# Patient Record
Sex: Female | Born: 1985
Health system: Southern US, Community
[De-identification: ages and names within clinical notes are randomized; demographics above are authoritative.]

## PROBLEM LIST (undated history)

## (undated) ENCOUNTER — Inpatient Hospital Stay (HOSPITAL_COMMUNITY): Payer: Self-pay

## (undated) DIAGNOSIS — O200 Threatened abortion: Secondary | ICD-10-CM

## (undated) DIAGNOSIS — Z3493 Encounter for supervision of normal pregnancy, unspecified, third trimester: Secondary | ICD-10-CM

## (undated) DIAGNOSIS — N200 Calculus of kidney: Secondary | ICD-10-CM

## (undated) HISTORY — PX: KNEE SURGERY: SHX244

## (undated) HISTORY — DX: Calculus of kidney: N20.0

---
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
## 2006-12-19 ENCOUNTER — Ambulatory Visit (HOSPITAL_BASED_OUTPATIENT_CLINIC_OR_DEPARTMENT_OTHER): Admission: RE | Admit: 2006-12-19 | Discharge: 2006-12-19 | Payer: Self-pay | Admitting: Orthopaedic Surgery

## 2008-06-24 ENCOUNTER — Ambulatory Visit (HOSPITAL_BASED_OUTPATIENT_CLINIC_OR_DEPARTMENT_OTHER): Admission: RE | Admit: 2008-06-24 | Discharge: 2008-06-25 | Payer: Self-pay | Admitting: Orthopaedic Surgery

## 2009-06-22 LAB — CONVERTED CEMR LAB: Pap Smear: NORMAL

## 2009-07-21 ENCOUNTER — Ambulatory Visit (HOSPITAL_BASED_OUTPATIENT_CLINIC_OR_DEPARTMENT_OTHER): Admission: RE | Admit: 2009-07-21 | Discharge: 2009-07-21 | Payer: Self-pay | Admitting: Orthopaedic Surgery

## 2010-07-07 ENCOUNTER — Ambulatory Visit: Payer: Self-pay | Admitting: Family Medicine

## 2010-07-07 DIAGNOSIS — R071 Chest pain on breathing: Secondary | ICD-10-CM | POA: Insufficient documentation

## 2010-07-07 DIAGNOSIS — G43009 Migraine without aura, not intractable, without status migrainosus: Secondary | ICD-10-CM | POA: Insufficient documentation

## 2010-08-03 ENCOUNTER — Telehealth: Payer: Self-pay | Admitting: Family Medicine

## 2010-08-10 ENCOUNTER — Ambulatory Visit: Payer: Self-pay | Admitting: Family Medicine

## 2010-08-10 DIAGNOSIS — K644 Residual hemorrhoidal skin tags: Secondary | ICD-10-CM | POA: Insufficient documentation

## 2010-08-23 ENCOUNTER — Telehealth: Payer: Self-pay | Admitting: Family Medicine

## 2010-10-09 ENCOUNTER — Telehealth: Payer: Self-pay | Admitting: Family Medicine

## 2010-11-23 NOTE — Assessment & Plan Note (Signed)
Summary: NEW TO EST//CCM   Vital Signs:  Patient profile:   25 year old female Menstrual status:  regular LMP:     04/21/2010 Height:      67.25 inches Weight:      147 pounds BMI:     22.94 Temp:     98.0 degrees F oral Pulse rate:   72 / minute Pulse rhythm:   regular Resp:     12 per minute BP sitting:   120 / 78  (left arm) Cuff size:   regular  Vitals Entered By: Sid Falcon LPN (July 07, 2010 2:16 PM) CC: Headache LMP (date): 04/21/2010     Menstrual Status regular Enter LMP: 04/21/2010 Last PAP Result normal   History of Present Illness: Patient is new to establish care. She has history of frequent headaches which have been diagnosed as migraine. She takes Maxalt which helps for acute headaches but she is concerned because of increased frequency approximately one sometimes 2 per week recently. No clear triggers. Has never used prophylactic medications previously.  Other issue is chest pain. She had 3 weeks of very fleeting pain usually lasting 5-10 minutes which is sharp and left chest wall area. No associated dyspnea. Denies any cough, fever, chills, hemoptysis, or pleuritic pain. Exercises regularly never associated with exercise.  No ilicit drug use.  Takes birth control and no other medications. Nonsmoker.  Family history significant for hyperlipidemia and hypertension father.  Preventive Screening-Counseling & Management  Alcohol-Tobacco     Smoking Status: never  Caffeine-Diet-Exercise     Does Patient Exercise: yes  Past History:  Family History: Last updated: 07/07/2010 Family History of Arthritis, mother Family History High cholesterol, father Family History Hypertension, father Family History of Prostate, grandfather  Social History: Last updated: 07/07/2010 Currently unemployed Single Never Smoked Alcohol use-yes Regular exercise-yes  Risk Factors: Exercise: yes (07/07/2010)  Risk Factors: Smoking Status: never  (07/07/2010)  Past Medical History:  kidney stones migraines  Past Surgical History: Tubes in ears 1988 Knee surgeries 08', 09', 10' PMH-FH-SH reviewed for relevance  Family History: Family History of Arthritis, mother Family History High cholesterol, father Family History Hypertension, father Family History of Prostate, grandfather  Social History: Currently unemployed Single Never Smoked Alcohol use-yes Regular exercise-yes Smoking Status:  never Does Patient Exercise:  yes  Review of Systems  The patient denies anorexia, fever, weight loss, syncope, dyspnea on exertion, peripheral edema, prolonged cough, hemoptysis, abdominal pain, muscle weakness, depression, and enlarged lymph nodes.    Physical Exam  General:  Well-developed,well-nourished,in no acute distress; alert,appropriate and cooperative throughout examination Head:  Normocephalic and atraumatic without obvious abnormalities. No apparent alopecia or balding. Eyes:  No corneal or conjunctival inflammation noted. EOMI. Perrla. Funduscopic exam benign, without hemorrhages, exudates or papilledema. Vision grossly normal. Mouth:  Oral mucosa and oropharynx without lesions or exudates.  Teeth in good repair. Neck:  No deformities, masses, or tenderness noted. Chest Wall:  No deformities, masses, or tenderness noted. Lungs:  Normal respiratory effort, chest expands symmetrically. Lungs are clear to auscultation, no crackles or wheezes. Heart:  Normal rate and regular rhythm. S1 and S2 normal without gallop, murmur, click, rub or other extra sounds. Abdomen:  Bowel sounds positive,abdomen soft and non-tender without masses, organomegaly or hernias noted. Extremities:  No clubbing, cyanosis, edema, or deformity noted with normal full range of motion of all joints.   Neurologic:  alert & oriented X3, cranial nerves II-XII intact, strength normal in all extremities, and gait normal.  Impression &  Recommendations:  Problem # 1:  MIGRAINE HEADACHE (ICD-346.90) Assessment Deteriorated discussed possible triggers and potential use of prophylactics given freq of > 4/month.  Reluctant to use beta blockers with her avid exercise regimen.  trial of Topamax with titration reviewed with pt. Her updated medication list for this problem includes:    Maxalt-mlt 10 Mg Tbdp (Rizatriptan benzoate) ..... Use as directed as needed  Problem # 2:  CHEST WALL PAIN, ANTERIOR (ZOX-096.04) Assessment: New Very fleeting atypical pain-?neuropathic or gaseous.  Observe and further evaluate if more freq or worsening.  Problem # 3:  Preventive Health Care (ICD-V70.0) discussed immunizations..  Needs Tdap and flu vaccine and pt consents to both.  Complete Medication List: 1)  Microgestin Fe 1.5/30 1.5-30 Mg-mcg Tabs (Norethin ace-eth estrad-fe) .... Daily as directed 2)  Maxalt-mlt 10 Mg Tbdp (Rizatriptan benzoate) .... Use as directed as needed 3)  Topamax 25 Mg Tabs (Topiramate) .... Take as directed.  Other Orders: Tdap => 69yrs IM (54098) Admin 1st Vaccine (11914) Flu Vaccine 98yrs + (78295)  Patient Instructions: 1)  Please schedule a follow-up appointment in 1 month.  2)  Take Topamax as follows and titrate at weekly intervals: 3)  start 25 mg one tablet AM, then 25 mg two times a day , then 25 mg AM and 50 mg PM, then 50 mg two times a day  Prescriptions: TOPAMAX 25 MG TABS (TOPIRAMATE) take as directed.  #120 x 1   Entered and Authorized by:   Evelena Peat MD   Signed by:   Evelena Peat MD on 07/07/2010   Method used:   Print then Give to Patient   RxID:   [id number]   Preventive Care Screening  Pap Smear:    Date:  06/22/2009    Results:  normal      Colonscopy 2009    Immunizations Administered:  Tetanus Vaccine:    Vaccine Type: Tdap    Site: left deltoid    Mfr: GlaxoSmithKline    Dose: 0.5 ml    Route: IM    Given by: Sid Falcon LPN    Exp. Date:  07/12/2012    Lot #: [id number]         Flu Vaccine Consent Questions     Do you have a history of severe allergic reactions to this vaccine? no    Any prior history of allergic reactions to egg and/or gelatin? no    Do you have a sensitivity to the preservative Thimersol? no    Do you have a past history of Guillan-Barre Syndrome? no    Do you currently have an acute febrile illness? no    Have you ever had a severe reaction to latex? no    Vaccine information given and explained to patient? yes    Are you currently pregnant? no    Lot Number:[id number]   Exp Date:04/21/2011   Site Given  Left Deltoid IM

## 2010-11-23 NOTE — Progress Notes (Signed)
Summary: Pt req 3 month supply of Topomax to [facility]  Phone Note Refill Request Call back at [phone]  Patsy cell Message from:  mom-Patsy on October 09, 2010 4:55 PM  Refills Requested: Medication #1:  TOPAMAX 50 MG TABS one by mouth two times a day   Dosage confirmed as above?Dosage Confirmed   Supply Requested: 3 months Pts mom is req this to be called in for a 3 month supply.    Method Requested: Telephone to Pharmacy Initial call taken by: Lucy Antigua,  October 09, 2010 4:55 PM    Prescriptions: TOPAMAX 50 MG TABS (TOPIRAMATE) one by mouth two times a day  #180 x 3   Entered by:   Sid Falcon LPN   Authorized by:   Evelena Peat MD   Signed by:   Sid Falcon LPN on 45/40/9811   Method used:   Electronically to        [facility]* (retail)       [address]       [address] Shipping/mailing       [place], [place]  [address]       Ph: [phone]       Fax: [phone]   RxID:   [id number]

## 2010-11-23 NOTE — Progress Notes (Signed)
Summary: Pt req Hydrocortisone Suppository for Hemorrhoids  Phone Note Call from Patient Call back at [phone] pts cell   Caller: Patient Summary of Call: Pt called and has hemorrhoids. Is req med, Hydrocortisone Suppository.  Pt is out of town and is req med be sent to [facility] in [place] [phone]. Pt is sch to come in to see Dr. Caryl Never in approx 1 week.  Initial call taken by: Lucy Antigua,  August 03, 2010 11:02 AM  Follow-up for Phone Call        Rx called in as requested, pt informed on VM Follow-up by: Sid Falcon LPN,  August 03, 2010 5:40 PM    New/Updated Medications: HYDROCORTISONE ACETATE 25 MG SUPP (HYDROCORTISONE ACETATE) one supp inserted two times a day Prescriptions: HYDROCORTISONE ACETATE 25 MG SUPP (HYDROCORTISONE ACETATE) one supp inserted two times a day  #30 x 1   Entered by:   Sid Falcon LPN   Authorized by:   Evelena Peat MD   Signed by:   Sid Falcon LPN on 44/10/270   Method used:   Telephoned to ...         RxID:   [id number]

## 2010-11-23 NOTE — Assessment & Plan Note (Signed)
Summary: 1 month fup//ccm PT RSC/NJR----PT [facility] // RS   Vital Signs:  Patient profile:   25 year old female Menstrual status:  regular Weight:      150 pounds Temp:     97.6 degrees F oral BP sitting:   120 / 84  (left arm) Cuff size:   regular  Vitals Entered By: Sid Falcon LPN (August 10, 2010 8:54 AM)  History of Present Illness: Patient here for followup migraine headaches  Initiated Topamax and she has done extremely well. No side effects. Has only had one very mild headache since initiation. Overall greatly improved and happy with treatment.   Was having 4-5 migraines per month.  Also history of external hemorrhoids. Recent use of hydrocortisone suppositories. Improved past few days. No recent bleeding. No constipation issues. Takes stool softener and lots of dietary fiber. No recent straining.  Allergies (verified): No Known Drug Allergies  Past History:  Past Medical History: Last updated: 07/07/2010  kidney stones migraines  Review of Systems      See HPI  Physical Exam  General:  Well-developed,well-nourished,in no acute distress; alert,appropriate and cooperative throughout examination Lungs:  Normal respiratory effort, chest expands symmetrically. Lungs are clear to auscultation, no crackles or wheezes. Heart:  Normal rate and regular rhythm. S1 and S2 normal without gallop, murmur, click, rub or other extra sounds.   Impression & Recommendations:  Problem # 1:  MIGRAINE HEADACHE (ICD-346.90) Assessment Improved  Her updated medication list for this problem includes:    Maxalt-mlt 10 Mg Tbdp (Rizatriptan benzoate) ..... Use as directed as needed  Problem # 2:  HEMORRHOIDS, EXTERNAL (ICD-455.3) discussed measures to reduce constipation.  Discussed possible anoscopy but elected to wait since symptoms improved.  Complete Medication List: 1)  Microgestin Fe 1.5/30 1.5-30 Mg-mcg Tabs (Norethin ace-eth estrad-fe) .... Daily as directed 2)  Maxalt-mlt  10 Mg Tbdp (Rizatriptan benzoate) .... Use as directed as needed 3)  Topamax 50 Mg Tabs (Topiramate) .... One by mouth two times a day 4)  Hydrocortisone Acetate 25 Mg Supp (Hydrocortisone acetate) .... One supp inserted two times a day  Patient Instructions: 1)  Please schedule a follow-up appointment as needed .  Prescriptions: TOPAMAX 50 MG TABS (TOPIRAMATE) one by mouth two times a day  #60 x 11   Entered and Authorized by:   Evelena Peat MD   Signed by:   Evelena Peat MD on 08/10/2010   Method used:   Electronically to        [facility]* (retail)       [address]       [address] Shipping/mailing       [place], [place]  [address]       Ph: [phone]       Fax: [phone]   RxID:   [id number]    Orders Added: 1)  Est. Patient Level III [84132]

## 2010-11-23 NOTE — Progress Notes (Signed)
Summary: Diflucan  Phone Note Call from Patient Call back at Work Phone [phone]   Summary of Call: Pt thinks the hemorrhoid meds are giving her a vaginal infection.  ?????  Would like Diflucan?????   Uses Hydrocorisone Supp for hemorrhoids.  Initial call taken by: Lynann Beaver CMA AAMA,  August 23, 2010 10:46 AM  Follow-up for Phone Call        If she is having symptoms suggestive of yeast vaginitis such as pruritis and whitish vag discharge, may call in Diflucan 150 mg by mouth times one dose. Follow-up by: Evelena Peat MD,  August 23, 2010 10:52 AM  Additional Follow-up for Phone Call Additional follow up Details #1::        Pt called back and is req med be called in to [facility] in [place] [address] [place], [place] [phone]      Additional Follow-up by: Lucy Antigua,  August 23, 2010 11:17 AM    New/Updated Medications: DIFLUCAN 150 MG TABS (FLUCONAZOLE) one by mouth now Prescriptions: DIFLUCAN 150 MG TABS (FLUCONAZOLE) one by mouth now  #1 x 0   Entered by:   Lynann Beaver CMA AAMA   Authorized by:   Evelena Peat MD   Signed by:   Lynann Beaver CMA AAMA on 08/23/2010   Method used:   Electronically to        [id number]* (retail)       [address]       [place], [place]  [address]       Ph: [phone]       Fax: [phone]   RxID:   [id number]  Notified pt.  Appended Document: Diflucan No 150 mg. Diflucan in stock, so it was changed to 100 mg. and same dosage.

## 2011-01-26 LAB — POCT HEMOGLOBIN-HEMACUE: Hemoglobin: 15.5 g/dL — ABNORMAL HIGH (ref 12.0–15.0)

## 2011-03-06 NOTE — Op Note (Signed)
NAMEROREY, HODGES             ACCOUNT NO.:  [id number]   MEDICAL RECORD NO.:  [id number]          PATIENT TYPE:  AMB   LOCATION:  DSC                          FACILITY:  [facility]   PHYSICIAN:  Claude Manges. Whitfield, M.D.DATE OF BIRTH:  Jan 22, 1986   DATE OF PROCEDURE:  06/24/2008  DATE OF DISCHARGE:                               OPERATIVE REPORT   PREOPERATIVE DIAGNOSIS:  Displaced bucket-handle tear of medial  meniscus, right knee to anterior cruciate ligament, right knee.   POSTOPERATIVE DIAGNOSIS:  Displaced bucket-handle tear of medial  meniscus right knee with complete tear of anterior cruciate ligament.   PROCEDURES:  1. Diagnostic arthroscopy, right knee.  2. Debridement and partial medial meniscectomy.  3. Autograft bone-tendon-bone anterior cruciate ligament      reconstruction.   SURGEON:  Claude Manges. Cleophas Dunker, MD   ASSISTANT:  Oris Drone. Petrarca, PA-C   ANESTHESIA:  General with supplemental femoral nerve block.   COMPLICATIONS:  None.   HISTORY:  This 25 year old female works and lives in [place].  Playing  basketball several months ago, she injured her right knee.  She was seen  by local orthopedist with the possibility of an ACL tear and medial  meniscal tear of her right knee.  She has had several episodes of what  appears to be meniscal subluxation.  Initially, she had some trouble  with giving way sensation of her right knee, but she preferred to come  back to [facility] to be with her family for further evaluation.  She  has not had an MRI scan.  Clinically, she does have an ACL tear and a  tear of the medial meniscus, possibly bucket-handle and now to have an  arthroscopic evaluation.   PROCEDURE:  With the patient comfortable on the operating table and  under general orotracheal anesthesia, the right lower extremity was  placed in a thigh tourniquet.  The patient did have a preoperative  femoral nerve block.  The leg was then prepped with DuraPrep from  the  thigh holder and tourniquet to the ankle.  Sterile draping was  performed.   A examination under anesthesia revealed an anterior drawer sign and  positive Lachman's.   Diagnostic arthroscopy was performed using the medial and lateral  parapatellar tendon puncture site.  Arthroscope was placed medially.  There was a small clear yellow joint effusion.   Diagnostic arthroscopy revealed no evidence of loose material in the  superior pouch.  The patella tracked in the midline.  The lateral  compartment was clear of meniscal pathology or chondromalacia.   The ACL was completely absent.  PCL was intact.  There was a displaced  bucket-handle tear of the medial meniscus.  It was subluxed into the  intercondylar notch.  It was quite frayed and appeared to be in the  white-white zone.  It was significantly shredded particularly in the  posterior third.  I elected to debride the meniscus.  I resected its  anterior margin and posterior margin and debrided it with the coude  shaver, the basket forceps, and then sealed it with the ArthroCare wand.  At that point, I  could further evaluate the intercondylar notch.  There  was an anterior drawer sign.  We elected to proceed with a autograft,  ACL reconstruction using bone-tendon-bone.   The extremity was elevated.  It was Esmarch exsanguinated with the  proximal tourniquet at 350 mmHg.   A longitudinal incision was made just medial to the midline of the  patellar tendon extending from the inferior pole of the patella to the  tibial tubercle via sharp dissection.  Incision was carried down to the  subcutaneous tissue to the level of patellar tendon sheath.  This was  incised and carefully retracted medially and laterally.  We retracted  the skin so that we could see the entire patella.  There was at least 30  mm of patellar thickness, so we used a 10-mm double blade.  We had a 25-  mm bone plug proximally and a 25-mm bone plug distally in the  tibia with  a 10-mm thickness of the central third patellar tendon.  This was nicely  excised with nice bone plugs with proximally and distally.   Jacqualine Code prepared the graft and was with me throughout the  procedure.  The bone plugs were 9 mm at both ends and took the graft  essentially.   An intercondylar notch was then debrided with sort of an A-frame shape.  I used a 4 mm hooded bur to have a nice notchplasty and I could easily  visualize the posterior capsule.   The Arthrex guide system was utilized.  Initial hole was then made in  the proximal tibia through a small separate incision.  We inserted the  guide pin and then used the Cheatle guide to place into an perfectly  anatomic position.  A 9-mm bur was used to drill and was used to make a  9-mm hole.  We then debrided the soft tissue from the entrance into the  joint and any bony material from within the canal.   The femoral guide was inserted.  The knee was flexed beyond 90 degrees.  The Beath guide pin was then inserted and externalized along the distal  anterolateral thigh.  The 9-mm bur was then placed in the tibial hole,  into the notch, and into the femur.  We made a small footprint and felt  that we had complete bone circumferentially.  We made a 30-mm hole as  the bone plug on remeasurement was about 27-28 mm in length.  We cleaned  the femoral hole and found that we had complete bone circumferentially.  Suture on the femoral plug of the graft was then placed through the  Beath needle eye and then externalized through the small puncture wound  along the anterior distal thigh.  We carefully guided the graft to the  tibial hole, into the intercondylar notch, and into the femoral hole.  We had a very nice fit.  We applied tension of both grafts and then  through full range of motion the graft was recovered and it was nice and  tight with negative anterior drawer sign.  Small incision was then made  through the fat  pad through the graft harvest site.  Nitinol wire was  inserted.  A 7 x 25 interference screw was then inserted without  difficulty.  The sheath and the guide pin were then removed.  We checked  the screw, it was nice and tight.  With the knee flexed at about 30  degrees with a posterior drawer, a second interference screw was  placed  over the tibial plug measuring 8 x 25.  After inserting the guide pin,  the guide pin was removed and then we checked the graft, it was nice and  tight.  We had full extension and flexion without any abnormal tension  and negative anterior drawer sign.   The joint was irrigated with saline solution.  I applied bone graft from  the tibia into the patellar defect.  The patellar tendon sheath was  completely repaired with a running 0 Vicryl, the subcu with 2-0 Vicryl,  and skin closed with Steri-Strips over Benzoin.  Marcaine with  epinephrine was injected.  Sterile bulky dressing was applied.  Tourniquet was released at about an hour and half.   The patient tolerated the procedure without complications.   PLAN:  Recovery care, crutches, and office in 1 week.      Claude Manges. Cleophas Dunker, M.D.  Electronically Signed     PWW/MEDQ  D:  06/24/2008  T:  06/25/2008  Job:  [id number]

## 2011-03-09 NOTE — Op Note (Signed)
NAMESIMARA, RHYNER             ACCOUNT NO.:  [id number]   MEDICAL RECORD NO.:  [id number]          PATIENT TYPE:  AMB   LOCATION:  DSC                          FACILITY:  [facility]   PHYSICIAN:  Claude Manges. Whitfield, M.D.DATE OF BIRTH:  Dec 07, 1985   DATE OF PROCEDURE:  12/19/2006  DATE OF DISCHARGE:                               OPERATIVE REPORT   PREOPERATIVE DIAGNOSIS:  1. Tear medial meniscus left knee.  2. Possible tear of anterior cruciate ligament.   POSTOPERATIVE DIAGNOSIS:  1. Displaced bucket handle tear medial meniscus.  2. Complete tear of anterior cruciate ligament.   PROCEDURES:  1. Diagnostic arthroscopy left knee.  2. Debridement of torn anterior cruciate ligament.  3. Reduction of bucket handle tear medial meniscus and primary repair.   SURGEON:  Claude Manges. Cleophas Dunker, M.D.   ANESTHESIA:  IV sedation and local Xylocaine with epinephrine.   COMPLICATIONS:  None.   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 She had the immediate onset of  pain and swelling.  She had been using crutches and Vicodin for pain.  26 years seen locally in [place], [place]. An MRI scan was performed  but the films were incomplete and, therefore, nondiagnostic.  She was  seen yesterday in the office with evidence of a locked knee, a large  hemarthrosis. 80 mL of blood were removed from the knee.  There is a  concern that she has an ACL tear and a displaced bucket handle tear of  the medial meniscus now to have an arthroscopy.   PROCEDURE:  With the patient comfortable on the operating table and  under IV sedation, the left lower extremity was placed in a thigh  holder.  The leg was then prepped with DuraPrep from the thigh holder to  the ankle.  Sterile draping was performed.   Diagnostic arthroscopy was performed using the medial and lateral  parapatellar tendon stab wound.  There was still hemarthrosis which was  evacuated and the knee would still not completely extend.  Diagnostic  arthroscopy revealed an intact patella without any loose bodies in the  superior pouch.  I did not see any bleeding in the superior pouch.  The  lateral compartment was clear of meniscal pathology or injury. The  cartilage to the lateral compartment was intact.  There was a complete  tear of the anterior cruciate ligament with a large bulbous stump in the  intercondylar notch on the tibial side. The ACL was not apparent within  the notch or its femoral attachment.  There was a bulbous knot that  would flip from medial to lateral and, accordingly, this was debrided.  The PCL was intact.  I would not elicit an anterior drawer sign.   The medial compartment was then evaluated.  There was a displaced or  flipped bucket handle tear of the posterior third of the medial  meniscus.  I was able to reduce this through the medial portal with a  probe and, at that point, the knee would fully extend. It appeared to be  very  close to the red white zone and, accordingly, I elected to repair  using the Innovasive Clear Fix System.   I switched the arthroscope to the medial compartment to better visualize  the medial compartment and the meniscus and then inserted the probe  guides from the lateral portal. The meniscus was reduced.  I debrided  any rough edges.  The cannula was inserted followed by the sharp probe  and then the 2 mm wide 10 mm long threaded Clear Fix anchor.  I used  three in all and I thought I had a very nice repair, it was nice and  tight, I could not dislodge the meniscus and it was very easily to  dislodge it even after reduction prior to repair. The joint was then  copiously irrigated with saline solution.  The two puncture sites were  left open and infiltrated with 0.25% Marcaine with epinephrine.  A  sterile bulky dressing was applied followed by an Ace bandage.   PLAN:  Crutches, knee immobilizer, office  first of the week, Vicodin  and/or Percocet for pain.      Claude Manges. Cleophas Dunker, M.D.  Electronically Signed     PWW/MEDQ  D:  12/19/2006  T:  12/19/2006  Job:  [id number]

## 2011-05-28 ENCOUNTER — Encounter: Payer: Self-pay | Admitting: Family Medicine

## 2011-05-28 ENCOUNTER — Ambulatory Visit (INDEPENDENT_AMBULATORY_CARE_PROVIDER_SITE_OTHER): Payer: 59 | Admitting: Family Medicine

## 2011-05-28 VITALS — BP 120/80 | Temp 97.4°F | Wt 139.0 lb

## 2011-05-28 DIAGNOSIS — G43909 Migraine, unspecified, not intractable, without status migrainosus: Secondary | ICD-10-CM

## 2011-05-28 MED ORDER — SUMATRIPTAN SUCCINATE 100 MG PO TABS: ORAL_TABLET | ORAL | Status: DC

## 2011-05-28 NOTE — Progress Notes (Signed)
  Subjective:    Patient ID: Elizabeth Washington, female    DOB: 1986/05/24, 25 y.o.   MRN: [id number]  HPI History of migraine headaches.  Location is usually behind right eye. Sometimes one to 2 headaches per week. Throbbing quality moderate to severe. Usually relieved with sleep. Minimal relief with ibuprofen. Previously good success with Maxalt. Has occasional nausea but no vomiting. No photophobia. Worsens with activity. Takes Topamax 50 mg twice daily initially this seemed to prevent bit less effective recently. No correlation of headaches to menstrual periods.  No recent fever or chills. No purulent drainage. No postnasal drip symptoms. No clear triggers   Review of Systems  Constitutional: Negative for fever, chills, appetite change and unexpected weight change.  HENT: Negative for sore throat, neck stiffness, postnasal drip and sinus pressure.   Neurological: Positive for headaches. Negative for dizziness, seizures, syncope and weakness.  Hematological: Negative for adenopathy.       Objective:   Physical Exam  Constitutional: She is oriented to person, place, and time. She appears well-developed and well-nourished. No distress.  HENT:  Head: Normocephalic and atraumatic.  Right Ear: External ear normal.  Left Ear: External ear normal.  Mouth/Throat: Oropharynx is clear and moist.  Eyes: Pupils are equal, round, and reactive to light.       Funduscopic exam normal  Neck: Neck supple.  Cardiovascular: Normal rate, regular rhythm and normal heart sounds.   Pulmonary/Chest: Effort normal and breath sounds normal. No respiratory distress. She has no wheezes. She has no rales.  Lymphadenopathy:    She has no cervical adenopathy.  Neurological: She is alert and oriented to person, place, and time. No cranial nerve deficit.          Assessment & Plan:  Migraine headaches. Discussed possible triggers. Continue Topamax. Discussed other possible prophylactic measures. Try Imitrex  100 mg at onset of migraine and repeat in 2 hours as needed with maximum of 2 in 24 hours. If headaches increasing in frequency consider [facility] for [facility] evaluation

## 2011-08-27 ENCOUNTER — Telehealth: Payer: Self-pay | Admitting: Family Medicine

## 2011-08-27 MED ORDER — AZITHROMYCIN 1 G PO PACK: 1.0000 | PACK | Freq: Once | ORAL | Status: AC

## 2011-08-27 NOTE — Telephone Encounter (Signed)
Pt informed Rx sent. 

## 2011-08-27 NOTE — Telephone Encounter (Signed)
May call in but needs to be seen if no better in one week.

## 2011-08-27 NOTE — Telephone Encounter (Signed)
Pt would like a zpak call into [facility] [phone]  for cough/chest congestion/no fever.

## 2011-09-05 ENCOUNTER — Other Ambulatory Visit: Payer: Self-pay | Admitting: Family Medicine

## 2012-01-07 ENCOUNTER — Telehealth: Payer: Self-pay | Admitting: Family Medicine

## 2012-01-07 MED ORDER — TOPIRAMATE 50 MG PO TABS: 50.0000 mg | ORAL_TABLET | Freq: Two times a day (BID) | ORAL | Status: DC

## 2012-01-07 NOTE — Telephone Encounter (Signed)
Printed and signed RX faxed to [facility] at [phone], Rx confirmation received

## 2012-01-07 NOTE — Telephone Encounter (Signed)
Pt requesting a refill on her topiramate (TOPAMAX) 50 MG tablet Uses [facility].

## 2012-04-07 ENCOUNTER — Other Ambulatory Visit: Payer: Self-pay | Admitting: *Deleted

## 2012-04-07 ENCOUNTER — Telehealth: Payer: Self-pay | Admitting: Family Medicine

## 2012-04-07 MED ORDER — TOPIRAMATE 50 MG PO TABS: 50.0000 mg | ORAL_TABLET | Freq: Two times a day (BID) | ORAL | Status: DC

## 2012-04-07 NOTE — Telephone Encounter (Signed)
Pt states before she can receive a refill on topiramate (TOPAMAX) 50 MG tablet  She needs an OV. Pt stated she no longer on her parents insurance and it on [employer] and it will not cover an OV and pt does not have the money to pay out of pocket. Pt requesting to be contacted

## 2012-04-07 NOTE — Telephone Encounter (Signed)
Go ahead and refill for 6 months but will need office f/u before further refills.

## 2012-04-07 NOTE — Telephone Encounter (Signed)
Please advise, last OV was Aug of 2012

## 2012-04-07 NOTE — Telephone Encounter (Signed)
Pt informed on her VM that we will fill for 6 months and then will need return OV

## 2012-04-07 NOTE — Telephone Encounter (Signed)
Pt requested Rx be called into [facility] at [phone]

## 2012-08-04 ENCOUNTER — Telehealth: Payer: Self-pay | Admitting: Family Medicine

## 2012-08-04 MED ORDER — TOPIRAMATE 50 MG PO TABS: 50.0000 mg | ORAL_TABLET | Freq: Two times a day (BID) | ORAL | Status: DC

## 2012-08-04 NOTE — Telephone Encounter (Signed)
Pt called and has an ov with Dr Caryl Never next wk. Pt is going to run out of topiramate (TOPAMAX) 50 MG tablet before appt date. Pt is req a few pills to last until appt. Pls call in to [facility] pharmacy [phone]. Pt is aware that Dr Caryl Never is out of office this wk. Pls call in this wk.

## 2012-08-06 ENCOUNTER — Other Ambulatory Visit: Payer: Self-pay | Admitting: *Deleted

## 2012-08-06 MED ORDER — TOPIRAMATE 50 MG PO TABS: 50.0000 mg | ORAL_TABLET | Freq: Two times a day (BID) | ORAL | Status: DC

## 2012-08-06 NOTE — Telephone Encounter (Signed)
Pt called and said that the # to [facility] pharmacy is Phone # [phone] or fax# [phone].

## 2012-08-06 NOTE — Telephone Encounter (Signed)
Rx will be faxed as the phone number is the same as given me this AM, and it is only for students, not providers.

## 2012-08-06 NOTE — Telephone Encounter (Signed)
The number given to me for pharmacy did not allow me to call in Rx or talk to anyone in the pharmacy.  Pt informed and she will call back with another number

## 2012-08-12 ENCOUNTER — Ambulatory Visit (INDEPENDENT_AMBULATORY_CARE_PROVIDER_SITE_OTHER): Payer: Self-pay | Admitting: Family Medicine

## 2012-08-12 ENCOUNTER — Encounter: Payer: Self-pay | Admitting: Family Medicine

## 2012-08-12 VITALS — BP 120/84 | Temp 97.8°F | Wt 147.0 lb

## 2012-08-12 DIAGNOSIS — G43909 Migraine, unspecified, not intractable, without status migrainosus: Secondary | ICD-10-CM

## 2012-08-12 MED ORDER — TOPIRAMATE 50 MG PO TABS: 50.0000 mg | ORAL_TABLET | Freq: Two times a day (BID) | ORAL | Status: DC

## 2012-08-12 MED ORDER — ELETRIPTAN HYDROBROMIDE 40 MG PO TABS: 40.0000 mg | ORAL_TABLET | ORAL | Status: DC | PRN

## 2012-08-12 NOTE — Progress Notes (Signed)
  Subjective:    Patient ID: Elizabeth Washington, female    DOB: [birth year], 26 y.o.   MRN: [id number]  HPI  This is a followup migraine headaches. These occur usually around her menstrual cycle. Greatly improved with Topamax. Both frequency and severity have decreased. Requesting refills of Topamax. She is on birth control regularly. She is aware that she should not be on Topamax if eventually pursuing pregnancy. Her migraine headaches usually occur behind her right eye. Occasional nausea but no vomiting. Tried Imitrex but had some side effects. She had some nausea following this. She is willing to consider another tryptan medication. No recent change in headache character.  She is studying for her Masters degree at [employer]   Review of Systems  Constitutional: Negative for fever, diaphoresis, appetite change and unexpected weight change.  Respiratory: Negative for shortness of breath.   Cardiovascular: Negative for chest pain.  Neurological: Negative for dizziness and syncope.       Objective:   Physical Exam  Constitutional: She is oriented to person, place, and time. She appears well-developed and well-nourished.  HENT:  Mouth/Throat: Oropharynx is clear and moist.  Neck: Neck supple. No thyromegaly present.  Cardiovascular: Normal rate and regular rhythm.   Pulmonary/Chest: Effort normal and breath sounds normal. No respiratory distress. She has no wheezes. She has no rales.  Neurological: She is alert and oriented to person, place, and time.  Psychiatric: She has a normal mood and affect. Her behavior is normal.          Assessment & Plan:  Migraine headaches. Suspect menstrual related. Improved with Topamax. Refill Topamax for one year. Trial of Relpax 40 mg one at onset of migraine and may repeat once in 2 hours as needed. Discussed possible migraine triggers

## 2012-08-12 NOTE — Patient Instructions (Addendum)
Migraine Headache A migraine headache is an intense, throbbing pain on one or both sides of your head. A migraine can last for 30 minutes to several hours. CAUSES  The exact cause of a migraine headache is not always known. However, a migraine may be caused when nerves in the brain become irritated and release chemicals that cause inflammation. This causes pain. SYMPTOMS  Pain on one or both sides of your head.  Pulsating or throbbing pain.  Severe pain that prevents daily activities.  Pain that is aggravated by any physical activity.  Nausea, vomiting, or both.  Dizziness.  Pain with exposure to bright lights, loud noises, or activity.  General sensitivity to bright lights, loud noises, or smells. Before you get a migraine, you may get warning signs that a migraine is coming (aura). An aura may include:  Seeing flashing lights.  Seeing bright spots, halos, or zig-zag lines.  Having tunnel vision or blurred vision.  Having feelings of numbness or tingling.  Having trouble talking.  Having muscle weakness. MIGRAINE TRIGGERS  Alcohol.  Smoking.  Stress.  Menstruation.  Aged cheeses.  Foods or drinks that contain nitrates, glutamate, aspartame, or tyramine.  Lack of sleep.  Chocolate.  Caffeine.  Hunger.  Physical exertion.  Fatigue.  Medicines used to treat chest pain (nitroglycerine), birth control pills, estrogen, and some blood pressure medicines. DIAGNOSIS  A migraine headache is often diagnosed based on:  Symptoms.  Physical examination.  A CT scan or MRI of your head. TREATMENT Medicines may be given for pain and nausea. Medicines can also be given to help prevent recurrent migraines.  HOME CARE INSTRUCTIONS  Only take over-the-counter or prescription medicines for pain or discomfort as directed by your caregiver. The use of long-term narcotics is not recommended.  Lie down in a dark, quiet room when you have a migraine.  Keep a journal  to find out what may trigger your migraine headaches. For example, write down:  What you eat and drink.  How much sleep you get.  Any change to your diet or medicines.  Limit alcohol consumption.  Quit smoking if you smoke.  Get 7 to 9 hours of sleep, or as recommended by your caregiver.  Limit stress.  Keep lights dim if bright lights bother you and make your migraines worse. SEEK IMMEDIATE MEDICAL CARE IF:   Your migraine becomes severe.  You have a fever.  You have a stiff neck.  You have vision loss.  You have muscular weakness or loss of muscle control.  You start losing your balance or have trouble walking.  You feel faint or pass out.  You have severe symptoms that are different from your first symptoms. MAKE SURE YOU:   Understand these instructions.  Will watch your condition.  Will get help right away if you are not doing well or get worse. Document Released: 10/08/2005 Document Revised: 12/31/2011 Document Reviewed: 09/28/2011 ExitCare Patient Information 2013 [employer].  

## 2012-11-07 ENCOUNTER — Encounter: Payer: Self-pay | Admitting: Family Medicine

## 2012-11-07 ENCOUNTER — Ambulatory Visit (INDEPENDENT_AMBULATORY_CARE_PROVIDER_SITE_OTHER): Payer: BC Managed Care – PPO | Admitting: Family Medicine

## 2012-11-07 VITALS — BP 112/76 | HR 78 | Temp 98.1°F | Wt 147.0 lb

## 2012-11-07 DIAGNOSIS — A084 Viral intestinal infection, unspecified: Secondary | ICD-10-CM

## 2012-11-07 DIAGNOSIS — A088 Other specified intestinal infections: Secondary | ICD-10-CM

## 2012-11-07 NOTE — Progress Notes (Signed)
  Subjective:    Patient ID: Elizabeth Washington, female    DOB: August 12, 1986, 27 y.o.   MRN: [id number]  HPI Here for what she thinks it "food poisoning". 4 days ago she suddenly developed abdominal cramps, nausea and vomiting, and low grade fevers. This lasted through that night and then stopped by the next morning. She has not had and nausea or vomiting or fever for 3 days now. Her last BM was 2 days ago. Her appetite is down and she feels fatigued. She has been drinking fluids and eating a bland diet. She feels bloated but has no pain.    Review of Systems  Constitutional: Negative.   Respiratory: Negative.   Cardiovascular: Negative.   Gastrointestinal: Positive for abdominal distention. Negative for nausea, vomiting, abdominal pain, diarrhea, constipation and blood in stool.       Objective:   Physical Exam  Constitutional: She appears well-developed and well-nourished. No distress.  Abdominal: Soft. Bowel sounds are normal. She exhibits no distension and no mass. There is no tenderness. There is no rebound and no guarding.          Assessment & Plan:  Seems to be recovering from a GI virus. She will advance her diet as tolerated. I suggested she use a probiotic like Align daily for a few weeks to help her colon flora recover.

## 2012-12-06 ENCOUNTER — Telehealth: Payer: Self-pay | Admitting: Family Medicine

## 2012-12-06 ENCOUNTER — Other Ambulatory Visit: Payer: Self-pay | Admitting: *Deleted

## 2012-12-06 MED ORDER — TOPIRAMATE 50 MG PO TABS: 50.0000 mg | ORAL_TABLET | Freq: Two times a day (BID) | ORAL | Status: DC

## 2012-12-06 NOTE — Telephone Encounter (Signed)
Please call patient's mother when Rx is called in.

## 2012-12-06 NOTE — Telephone Encounter (Signed)
Rx was called into [facility] [address], only pharmacy on pt chart by mistake.  So I called in same to [facility] [address]

## 2012-12-06 NOTE — Telephone Encounter (Signed)
Pt mother walked into [facility] concerned that her daughters Topomax was not refilled.  It was explained to her our office have been closed due to inclement weather.  Because I was working with Dr Cato Mulligan at [facility], I called in 180 with 0 refills.  I did not want to further upset pt mother as we have been filling a 90 day supply in the past.  Mother informed Rx called in

## 2013-02-17 ENCOUNTER — Telehealth: Payer: Self-pay | Admitting: Family Medicine

## 2013-02-17 MED ORDER — TOPIRAMATE 50 MG PO TABS: 50.0000 mg | ORAL_TABLET | Freq: Two times a day (BID) | ORAL | Status: DC

## 2013-02-17 NOTE — Telephone Encounter (Signed)
Patient called stating that she need a refill of her topamax 50 mg 1po bid sent to [facility] [address]. Please assist.

## 2013-02-17 NOTE — Telephone Encounter (Signed)
Rx sent 

## 2013-05-20 ENCOUNTER — Telehealth: Payer: Self-pay | Admitting: Family Medicine

## 2013-05-20 NOTE — Telephone Encounter (Signed)
Appointment with Dr Clent Ridges was offered.

## 2013-05-20 NOTE — Telephone Encounter (Signed)
Patient Information:  Caller Name: Dennie Bible  Phone: [phone]  Patient: Elizabeth Washington, Elizabeth Washington  Gender: Female  DOB: [birth year]  Age: 27 Years  PCP: Evelena Peat [facility])  Pregnant: No  Office Follow Up:  Does the office need to follow up with this patient?: Yes  Instructions For The Office: Requesting medication to help with Migraine pain. Topamax and Ibuprofen not helping.   Symptoms  Reason For Call & Symptoms: Started as typical Migraine on 05/19/13 and today feels like ice pick is sticking through R eye and temple. Pain level #7 on 1-10 scale. Takes Topamax BID to prevent migraines- last took on 05/19/13 @ 1900 and it did not help. Took Ibuprofen 800 mgs at 0715 05/20/13 and Tramadol at 0745 05/20/13 and not helping. Afebrile. No other sx. Requesting medication for pain. [facility] [place]  Reviewed Health History In EMR: Yes  Reviewed Medications In EMR: Yes  Reviewed Allergies In EMR: Yes  Reviewed Surgeries / Procedures: Yes  Date of Onset of Symptoms: 05/19/2013  Treatments Tried: Topamax, Tramadol, Ibuprofen 800 mgs  Treatments Tried Worked: No OB / GYN:  LMP: 05/02/2013  Guideline(s) Used:  Headache  Disposition Per Guideline:   Callback by PCP or Subspecialist within 1 Hour  Reason For Disposition Reached:   Severe headache and has had severe headaches before  Advice Given:  Reassurance - Migraine Headache:  You have told me that this headache is similar to previous migraine headaches that you have had. If the pattern or severity of your headache changes, you will need to see your physician.  Migraine headaches are also called vascular headaches. A migraine can be anywhere from mild to severely painful. Sufferers often describe it as throbbing or pulsing. It is often just on one side. Associated symptoms include nausea and vomiting. Some individuals will have visual or other neurological warning symptoms (aura) that a migraine is coming.  This sounds like a  painful headache that you are having, but there are pain medications you can take and other instructions I can give you to reduce the pain.  Reassurance - Muscle Tension Headache:  The majority of headaches are caused by muscle tension.  Pain Medicines:  For pain relief, you can take either acetaminophen, ibuprofen, or naproxen.  Migraine Medication:   If your doctor has prescribed specific medication for your migraine, take it as directed as soon as the migraine starts.  Rest:   Lie down in a dark, quiet place and try to relax. Close your eyes and imagine your entire body relaxing.  Apply Cold to the Area:   Apply a cold wet washcloth or cold pack to the forehead for 20 minutes.  Stretching:   Stretch and massage any tight neck muscles.  Call Back If:  Headache lasts longer than 24 hours  You become worse.  Patient Will Follow Care Advice:  YES

## 2013-05-21 ENCOUNTER — Encounter: Payer: Self-pay | Admitting: Family Medicine

## 2013-05-21 ENCOUNTER — Ambulatory Visit (INDEPENDENT_AMBULATORY_CARE_PROVIDER_SITE_OTHER): Payer: BC Managed Care – PPO | Admitting: Family Medicine

## 2013-05-21 VITALS — BP 110/72 | HR 73 | Temp 97.9°F | Wt 150.0 lb

## 2013-05-21 DIAGNOSIS — G43909 Migraine, unspecified, not intractable, without status migrainosus: Secondary | ICD-10-CM

## 2013-05-21 MED ORDER — KETOROLAC TROMETHAMINE 60 MG/2ML IM SOLN
60.0000 mg | Freq: Once | INTRAMUSCULAR | Status: AC
  Administered 2013-05-21: 60 mg via INTRAMUSCULAR

## 2013-05-21 MED ORDER — SUMATRIPTAN SUCCINATE 50 MG PO TABS: 50.0000 mg | ORAL_TABLET | ORAL | Status: DC | PRN

## 2013-05-21 MED ORDER — PROMETHAZINE HCL 25 MG PO TABS: 25.0000 mg | ORAL_TABLET | Freq: Four times a day (QID) | ORAL | Status: DC | PRN

## 2013-05-21 MED ORDER — HYDROCODONE-ACETAMINOPHEN 5-325 MG PO TABS: 1.0000 | ORAL_TABLET | Freq: Four times a day (QID) | ORAL | Status: DC | PRN

## 2013-05-21 MED ORDER — TOPIRAMATE 50 MG PO TABS: 50.0000 mg | ORAL_TABLET | Freq: Two times a day (BID) | ORAL | Status: DC

## 2013-05-21 NOTE — Progress Notes (Signed)
  Subjective:    Patient ID: Elizabeth Washington, female    DOB: 07-05-1986, 27 y.o.   MRN: [id number]  HPI Here for a migraine that has lasted 3 days. It is typical for her, centered behind the right eye, and she has vomiting with it. She needs refills on Topamax. She tried Relpax but it caused her throat to get tight.    Review of Systems  Constitutional: Negative.   Respiratory: Negative.   Cardiovascular: Negative.   Neurological: Positive for headaches.       Objective:   Physical Exam  Constitutional: She is oriented to person, place, and time. She appears well-developed and well-nourished.  Eyes: Conjunctivae are normal. Pupils are equal, round, and reactive to light.  Neurological: She is alert and oriented to person, place, and time. She has normal reflexes. No cranial nerve deficit. She exhibits normal muscle tone. Coordination normal.          Assessment & Plan:  Given a Toradol shot. Use Vicodin and Phenergan prn. Written out of work yesterday and today. Try Imitrex 50 mg prn.

## 2013-05-21 NOTE — Addendum Note (Signed)
Addended by: Aniceto Boss A on: 05/21/2013 09:40 AM   Modules accepted: Orders

## 2013-10-12 ENCOUNTER — Encounter: Payer: Self-pay | Admitting: Family Medicine

## 2013-10-12 ENCOUNTER — Ambulatory Visit (INDEPENDENT_AMBULATORY_CARE_PROVIDER_SITE_OTHER): Payer: BC Managed Care – PPO | Admitting: Family Medicine

## 2013-10-12 VITALS — BP 110/70 | HR 67 | Temp 98.6°F | Wt 155.0 lb

## 2013-10-12 DIAGNOSIS — R51 Headache: Secondary | ICD-10-CM

## 2013-10-12 MED ORDER — PREDNISONE 10 MG PO TABS: ORAL_TABLET | ORAL | Status: DC

## 2013-10-12 MED ORDER — AMOXICILLIN-POT CLAVULANATE 875-125 MG PO TABS: 1.0000 | ORAL_TABLET | Freq: Two times a day (BID) | ORAL | Status: DC

## 2013-10-12 NOTE — Progress Notes (Signed)
Pre visit review using our clinic review tool, if applicable. No additional management support is needed unless otherwise documented below in the visit note. 

## 2013-10-12 NOTE — Progress Notes (Signed)
   Subjective:    Patient ID: Elizabeth Washington, female    DOB: 12/25/1985, 27 y.o.   MRN: [id number]  HPI Patient has history of migraine headaches. She presents now with slightly over 3 day history of headache behind right eye. Similar location headaches in past. Occasional throbbing. She's had some mild nausea off and on but no vomiting. No significant photophobia. Slightly worse with movement and activity. Denies any sinus congestion. No relief with sleep.  She recalls having somewhat similar headache several months ago. She took Imitrex with this headache without relief. She takes Topamax for prevention. Also use hydrocodone for this episode without relief. No clear triggering factors. No associated visual changes, fever, chills, purulent nasal secretions. She takes her Topamax regularly. She also takes birth control regularly and knows how vital this is while she is on Topamax.  Past Medical History  Diagnosis Date  . Kidney stone   . Migraine    Past Surgical History  Procedure Laterality Date  . Knee surgery  2008, 2009, 2010    reports that she has never smoked. She has never used smokeless tobacco. She reports that she drinks alcohol. She reports that she does not use illicit drugs. family history includes Arthritis in her mother; Cancer in an other family member; Heart disease in her father; Hyperlipidemia in her father. No Known Allergies    Review of Systems  Constitutional: Negative for fever, chills, appetite change and unexpected weight change.  Eyes: Negative for photophobia and visual disturbance.  Gastrointestinal: Positive for nausea. Negative for vomiting.  Neurological: Positive for headaches. Negative for dizziness, seizures, syncope and weakness.  Hematological: Negative for adenopathy.  Psychiatric/Behavioral: Negative for confusion.       Objective:   Physical Exam  Constitutional: She is oriented to person, place, and time. She appears well-developed  and well-nourished.  HENT:  Head: Normocephalic and atraumatic.  Mouth/Throat: Oropharynx is clear and moist.  Eyes: Pupils are equal, round, and reactive to light.  Neck: Neck supple.  Cardiovascular: Normal rate.   Pulmonary/Chest: Effort normal and breath sounds normal. No respiratory distress. She has no wheezes. She has no rales.  Lymphadenopathy:    She has no cervical adenopathy.  Neurological: She is alert and oriented to person, place, and time. No cranial nerve deficit. Coordination normal.          Assessment & Plan:  Right frontal headache. She has some features of vascular headache but somewhat unusual duration. This does not sound like tension type headache. She's already tried Imitrex and hydrocodone without much improvement. Doubt sinus infection. Trial of prednisone starting at 40 mg daily and taper. If she develops any: purulent nasal discharge, fever, or chills start Augmentin and otherwise hold antibiotics at this time. Touch base of headache not improving in the next 2 days.

## 2013-10-12 NOTE — Patient Instructions (Signed)
Start antibiotic for any fever or any purulent nasal discharge.

## 2013-11-03 ENCOUNTER — Other Ambulatory Visit: Payer: Self-pay | Admitting: Internal Medicine

## 2014-01-20 ENCOUNTER — Ambulatory Visit (INDEPENDENT_AMBULATORY_CARE_PROVIDER_SITE_OTHER): Payer: BC Managed Care – PPO | Admitting: Family Medicine

## 2014-01-20 VITALS — BP 110/70 | HR 68 | Temp 97.7°F | Wt 150.0 lb

## 2014-01-20 DIAGNOSIS — K644 Residual hemorrhoidal skin tags: Secondary | ICD-10-CM

## 2014-01-20 DIAGNOSIS — G43909 Migraine, unspecified, not intractable, without status migrainosus: Secondary | ICD-10-CM

## 2014-01-20 MED ORDER — HYDROCODONE-ACETAMINOPHEN 5-325 MG PO TABS: 1.0000 | ORAL_TABLET | ORAL | Status: DC | PRN

## 2014-01-20 MED ORDER — PROPRANOLOL HCL ER 60 MG PO CP24: 60.0000 mg | ORAL_CAPSULE | Freq: Every day | ORAL | Status: DC

## 2014-01-20 MED ORDER — HYDROCORTISONE 2.5 % RE CREA: 1.0000 "application " | TOPICAL_CREAM | Freq: Two times a day (BID) | RECTAL | Status: DC

## 2014-01-20 NOTE — Progress Notes (Signed)
   Subjective:    Patient ID: Elizabeth Washington, female    DOB: 1986/05/12, 28 y.o.   MRN: [id number]  HPI Long history of migraine headaches. She currently takes Topamax 50 mg twice daily. This is done a fairly good job preventing migraines but she wishes to make a change. She feels like she has some mental slowing and she would like a lipid other medication options. She's also consider things like acupuncture for prevention. She still has about one per month. She uses hydrocodone for breakthrough symptoms. She remains on birth control regularly.  No history of asthma. History external hemorrhoids and requesting refill of hydrocortisone 2.5% cream which she's used the past with good success.  Past Medical History  Diagnosis Date  . Kidney stone   . Migraine    Past Surgical History  Procedure Laterality Date  . Knee surgery  2008, 2009, 2010    reports that she has never smoked. She has never used smokeless tobacco. She reports that she drinks alcohol. She reports that she does not use illicit drugs. family history includes Arthritis in her mother; Cancer in an other family member; Heart disease in her father; Hyperlipidemia in her father. No Known Allergies    Review of Systems  Constitutional: Negative for fever, chills, appetite change and unexpected weight change.  Neurological: Negative for dizziness, seizures, syncope and headaches.  Psychiatric/Behavioral: Negative for dysphoric mood.       Objective:   Physical Exam  Constitutional: She is oriented to person, place, and time. She appears well-developed and well-nourished. No distress.  Cardiovascular: Normal rate and regular rhythm.   Pulmonary/Chest: Effort normal and breath sounds normal. No respiratory distress. She has no wheezes. She has no rales.  Musculoskeletal: She exhibits no edema.  Neurological: She is alert and oriented to person, place, and time. No cranial nerve deficit.          Assessment & Plan:    Migraine headaches. Patient requesting change from Topamax. We'll taper off Topamax and start Inderal long-acting 60 mg once daily. Review possible side effects.  External hemorrhoids. Refill Hydrocortone 2.5% cream twice daily as needed

## 2014-01-20 NOTE — Patient Instructions (Signed)
Reduce Topamax to one daily for 2 weeks then try discontinuing.

## 2014-01-20 NOTE — Progress Notes (Signed)
Pre visit review using our clinic review tool, if applicable. No additional management support is needed unless otherwise documented below in the visit note. 

## 2014-03-31 ENCOUNTER — Emergency Department (HOSPITAL_COMMUNITY)
Admission: EM | Admit: 2014-03-31 | Discharge: 2014-03-31 | Disposition: A | Discharge status: Home / Self Care | Payer: BC Managed Care – PPO | Attending: Emergency Medicine | Admitting: Emergency Medicine

## 2014-03-31 ENCOUNTER — Encounter (HOSPITAL_COMMUNITY): Payer: Self-pay | Admitting: Emergency Medicine

## 2014-03-31 DIAGNOSIS — G43109 Migraine with aura, not intractable, without status migrainosus: Secondary | ICD-10-CM | POA: Insufficient documentation

## 2014-03-31 DIAGNOSIS — R51 Headache: Secondary | ICD-10-CM

## 2014-03-31 DIAGNOSIS — Z87442 Personal history of urinary calculi: Secondary | ICD-10-CM | POA: Insufficient documentation

## 2014-03-31 DIAGNOSIS — R519 Headache, unspecified: Secondary | ICD-10-CM

## 2014-03-31 MED ORDER — DIPHENHYDRAMINE HCL 50 MG/ML IJ SOLN
25.0000 mg | Freq: Once | INTRAMUSCULAR | Status: AC
  Administered 2014-03-31: 25 mg via INTRAVENOUS
  Filled 2014-03-31: qty 1

## 2014-03-31 MED ORDER — SODIUM CHLORIDE 0.9 % IV BOLUS (SEPSIS)
1000.0000 mL | Freq: Once | INTRAVENOUS | Status: AC
  Administered 2014-03-31: 1000 mL via INTRAVENOUS

## 2014-03-31 MED ORDER — METOCLOPRAMIDE HCL 5 MG/ML IJ SOLN
10.0000 mg | Freq: Once | INTRAMUSCULAR | Status: AC
  Administered 2014-03-31: 10 mg via INTRAVENOUS
  Filled 2014-03-31: qty 2

## 2014-03-31 MED ORDER — KETOROLAC TROMETHAMINE 30 MG/ML IJ SOLN
30.0000 mg | Freq: Once | INTRAMUSCULAR | Status: AC
  Administered 2014-03-31: 30 mg via INTRAVENOUS
  Filled 2014-03-31: qty 1

## 2014-03-31 NOTE — Discharge Instructions (Signed)
Migraine Headache A migraine headache is an intense, throbbing pain on one or both sides of your head. A migraine can last for 30 minutes to several hours. CAUSES  The exact cause of a migraine headache is not always known. However, a migraine may be caused when nerves in the brain become irritated and release chemicals that cause inflammation. This causes pain. Certain things may also trigger migraines, such as:  Alcohol.  Smoking.  Stress.  Menstruation.  Aged cheeses.  Foods or drinks that contain nitrates, glutamate, aspartame, or tyramine.  Lack of sleep.  Chocolate.  Caffeine.  Hunger.  Physical exertion.  Fatigue.  Medicines used to treat chest pain (nitroglycerine), birth control pills, estrogen, and some blood pressure medicines. SIGNS AND SYMPTOMS  Pain on one or both sides of your head.  Pulsating or throbbing pain.  Severe pain that prevents daily activities.  Pain that is aggravated by any physical activity.  Nausea, vomiting, or both.  Dizziness.  Pain with exposure to bright lights, loud noises, or activity.  General sensitivity to bright lights, loud noises, or smells. Before you get a migraine, you may get warning signs that a migraine is coming (aura). An aura may include:  Seeing flashing lights.  Seeing bright spots, halos, or zig-zag lines.  Having tunnel vision or blurred vision.  Having feelings of numbness or tingling.  Having trouble talking.  Having muscle weakness. DIAGNOSIS  A migraine headache is often diagnosed based on:  Symptoms.  Physical exam.  A CT scan or MRI of your head. These imaging tests cannot diagnose migraines, but they can help rule out other causes of headaches. TREATMENT Medicines may be given for pain and nausea. Medicines can also be given to help prevent recurrent migraines.  HOME CARE INSTRUCTIONS  Only take over-the-counter or prescription medicines for pain or discomfort as directed by your  health care provider. The use of long-term narcotics is not recommended.  Lie down in a dark, quiet room when you have a migraine.  Keep a journal to find out what may trigger your migraine headaches. For example, write down:  What you eat and drink.  How much sleep you get.  Any change to your diet or medicines.  Limit alcohol consumption.  Quit smoking if you smoke.  Get 7 9 hours of sleep, or as recommended by your health care provider.  Limit stress.  Keep lights dim if bright lights bother you and make your migraines worse. SEEK IMMEDIATE MEDICAL CARE IF:   Your migraine becomes severe.  You have a fever.  You have a stiff neck.  You have vision loss.  You have muscular weakness or loss of muscle control.  You start losing your balance or have trouble walking.  You feel faint or pass out.  You have severe symptoms that are different from your first symptoms. MAKE SURE YOU:   Understand these instructions.  Will watch your condition.  Will get help right away if you are not doing well or get worse. Document Released: 10/08/2005 Document Revised: 07/29/2013 Document Reviewed: 06/15/2013 [employer] Patient Information 2014 [employer].  

## 2014-03-31 NOTE — ED Notes (Signed)
PT ambulated with baseline gait; VSS; A&Ox3; no signs of distress; respirations even and unlabored; skin warm and dry; no questions upon discharge.  

## 2014-03-31 NOTE — ED Notes (Signed)
Water given. Pt reports still feels pain but has decreased.

## 2014-03-31 NOTE — ED Notes (Signed)
Patient is resting comfortably. 

## 2014-03-31 NOTE — ED Notes (Signed)
Pt states migraine starting 3 days ago. Hx of them. Recently got off Topamax which she was on for years. OTC meds not working.

## 2014-03-31 NOTE — ED Provider Notes (Signed)
CSN: [id number]     Arrival date & time 03/31/14  [id number] History   First MD Initiated Contact with Patient 03/31/14 [phone]     Chief Complaint  Patient presents with  . Migraine     (Consider location/radiation/quality/duration/timing/severity/associated sxs/prior Treatment) HPI Comments: Patient presents with gradual onset right-sided headache for the past 3 days similar to previous migraines but more intense. She traveled from [place] 2 days ago and headaches started on the way back. She is been weaned off her Topamax because she was thinking of getting pregnant and has not had it for the past month. She endorses several episodes of nausea and vomiting and photophobia. No focal weakness, numbness or tingling. No bowel or bladder incontinence. No fevers. She's never had any imaging performed of her head. She denies any chest pain or shortness of breath. She is not on birth control.   The history is provided by the patient and the spouse.    Past Medical History  Diagnosis Date  . Kidney stone   . Migraine    Past Surgical History  Procedure Laterality Date  . Knee surgery  2008, 2009, 2010   Family History  Problem Relation Age of Onset  . Arthritis Mother   . Hyperlipidemia Father   . Heart disease Father   . Cancer      prostate/grandfather   History  Substance Use Topics  . Smoking status: Never Smoker   . Smokeless tobacco: Never Used  . Alcohol Use: Yes     Comment: rare   OB History   Grav Para Term Preterm Abortions TAB SAB Ect Mult Living                 Review of Systems  Constitutional: Negative for activity change and appetite change.  Eyes: Positive for photophobia.  Respiratory: Negative for cough, chest tightness and shortness of breath.   Cardiovascular: Negative for chest pain.  Gastrointestinal: Positive for nausea and vomiting. Negative for abdominal pain.  Genitourinary: Negative for dysuria, hematuria, vaginal bleeding and vaginal discharge.   Musculoskeletal: Negative for arthralgias, back pain and myalgias.  Skin: Negative for rash.  Neurological: Positive for headaches. Negative for dizziness and weakness.  A complete 10 system review of systems was obtained and all systems are negative except as noted in the HPI and PMH.      Allergies  Review of patient's allergies indicates no known allergies.  Home Medications   Prior to Admission medications   Medication Sig Start Date End Date Taking? Authorizing Provider  aspirin-acetaminophen-caffeine (EXCEDRIN MIGRAINE) [phone] MG per tablet Take 1 tablet by mouth every 6 (six) hours as needed for headache.   Yes Historical Provider, MD  HYDROcodone-acetaminophen (NORCO/VICODIN) 5-325 MG per tablet Take 1-2 tablets by mouth every 4 (four) hours as needed. 01/20/14  Yes Kristian CoveyBruce W Burchette, MD  hydrocortisone (PROCTOZONE-HC) 2.5 % rectal cream Place 1 application rectally 2 (two) times daily. 01/20/14  Yes Kristian CoveyBruce W Burchette, MD  ibuprofen (ADVIL,MOTRIN) 200 MG tablet Take 800 mg by mouth every 6 (six) hours as needed.   Yes Historical Provider, MD   BP 116/79  Pulse 68  Temp(Src) 97.7 F (36.5 C) (Oral)  Resp 17  SpO2 100%  LMP 03/15/2014 Physical Exam  Constitutional: She is oriented to person, place, and time. She appears well-developed and well-nourished. No distress.  HENT:  Head: Normocephalic and atraumatic.  Mouth/Throat: Oropharynx is clear and moist. No oropharyngeal exudate.  No temporal artery tenderness  Eyes: Conjunctivae and  EOM are normal. Pupils are equal, round, and reactive to light.  Neck: Normal range of motion. Neck supple.  No meningismus  Cardiovascular: Normal rate, regular rhythm, normal heart sounds and intact distal pulses.   Pulmonary/Chest: Effort normal and breath sounds normal. No respiratory distress.  Abdominal: Soft. Bowel sounds are normal. There is no tenderness. There is no rebound and no guarding.  Musculoskeletal: Normal range of  motion. She exhibits no edema and no tenderness.  Neurological: She is alert and oriented to person, place, and time. No cranial nerve deficit. She exhibits normal muscle tone. Coordination normal.  CN 2-12 intact, no ataxia on finger to nose, no nystagmus, 5/5 strength throughout, no pronator drift, Romberg negative, normal gait.   Skin: Skin is warm.    ED Course  Procedures (including critical care time) Labs Review Labs Reviewed - No data to display  Imaging Review No results found.   EKG Interpretation None      MDM   Final diagnoses:  Headache   gradual onset right-sided headache similar to previous migraines. No fever. No focal weakness, numbness or tingling.   No neurological deficits. No indication for emergent imaging. No thunderclap onset. Pain is controlled with migraine cocktail and patient is feeling better. She is ambulatory and tolerating by mouth. She is requesting to go home.  She will follow up with her PCP and with neurology. Return precautions discussed.  Glynn Octave, MD 03/31/14 [phone]

## 2014-04-08 ENCOUNTER — Telehealth: Payer: Self-pay | Admitting: *Deleted

## 2014-04-08 NOTE — Telephone Encounter (Signed)
Spoke with patient, appointment time has been changed 9 am on 04/14/14

## 2014-04-14 ENCOUNTER — Ambulatory Visit (INDEPENDENT_AMBULATORY_CARE_PROVIDER_SITE_OTHER): Payer: BC Managed Care – PPO | Admitting: Neurology

## 2014-04-14 ENCOUNTER — Encounter: Payer: Self-pay | Admitting: Neurology

## 2014-04-14 VITALS — BP 111/69 | HR 81 | Ht 68.0 in | Wt 154.0 lb

## 2014-04-14 DIAGNOSIS — G43009 Migraine without aura, not intractable, without status migrainosus: Secondary | ICD-10-CM

## 2014-04-14 NOTE — Progress Notes (Signed)
[facility]    Provider:  Dr Hosie PoissonSumner Referring Provider: Kristian CoveyBurchette, Bruce W, MD Primary Care Physician:  Kristian CoveyBURCHETTE,BRUCE W, MD  CC:  headache  HPI:  Elizabeth Washington is a 28 y.o. female here as a referral from Dr. Caryl NeverBurchette for headache  ED referral after gradual onset of right sided headache over 3 days. Headache started on trip back from [place]. Similar to prior migraines but more intense. Has been on Topamax in the past but stopped due to planning for pregnancy, has been off for >1 month. Most recent headache was right sided, stabbing throbbing type pain. Resolved after 4 days. + Nausea and emesis, + photophobia and phonophobia. Was similar to prior migraines but more intense. While on the topamax she was getting headaches around once per month but stopped as she felt it was not working.   Otherwise healthy. No recent fevers, neck stiffness, altered mental status.   Review of Systems: Out of a complete 14 system review, the patient complains of only the following symptoms, and all other reviewed systems are negative. + headache  History   Social History  . Marital Status: Single    Spouse Name: N/A    Number of Children: 0  . Years of Education: 12   Occupational History  .     Social History Main Topics  . Smoking status: Never Smoker   . Smokeless tobacco: Never Used  . Alcohol Use: Yes     Comment: rare  . Drug Use: No  . Sexual Activity: Not on file   Other Topics Concern  . Not on file   Social History Narrative   Patient lives at home with Husband Loraine LericheMark.   Patient has no children    Patient works at the [employer].    Patient has a Masters          Family History  Problem Relation Age of Onset  . Arthritis Mother   . Hyperlipidemia Father   . Heart disease Father   . Cancer      prostate/grandfather    Past Medical History  Diagnosis Date  . Kidney stone   . Migraine     Past Surgical History  Procedure  Laterality Date  . Knee surgery  2008, 2009, 2010    Current Outpatient Prescriptions  Medication Sig Dispense Refill  . Aspirin-Acetaminophen-Caffeine (EXCEDRIN MIGRAINE PO) Take by mouth.      Marland Kitchen. aspirin-acetaminophen-caffeine (EXCEDRIN MIGRAINE) 250-250-65 MG per tablet Take 1 tablet by mouth every 6 (six) hours as needed for headache.      Marland Kitchen. HYDROcodone-acetaminophen (NORCO/VICODIN) 5-325 MG per tablet Take 1-2 tablets by mouth every 4 (four) hours as needed.  30 tablet  0  . hydrocortisone (ANUSOL-HC) 2.5 % rectal cream Place 1 application rectally as needed.      Marland Kitchen. ibuprofen (ADVIL,MOTRIN) 200 MG tablet Take 800 mg by mouth every 6 (six) hours as needed.       No current facility-administered medications for this visit.    Allergies as of 04/14/2014  . (No Known Allergies)    Vitals: BP 111/69  Pulse 81  Ht 5\' 8"  (1.727 m)  Wt 154 lb (69.854 kg)  BMI 23.42 kg/m2  LMP 03/15/2014 Last Weight:  Wt Readings from Last 1 Encounters:  04/14/14 154 lb (69.854 kg)   Last Height:   Ht Readings from Last 1 Encounters:  04/14/14 5\' 8"  (1.727 m)     Physical exam: Exam: Gen: NAD, conversant Eyes:  anicteric sclerae, moist conjunctivae HENT: Atraumatic, oropharynx clear Neck: Trachea midline; supple,  Lungs: CTA, no wheezing, rales, rhonic                          CV: RRR, no MRG Abdomen: Soft, non-tender;  Extremities: No peripheral edema  Skin: Normal temperature, no rash,  Psych: Appropriate affect, pleasant  Neuro: MS: AA&Ox3, appropriately interactive, normal affect   Attention: WORLD backwards  Speech: fluent w/o paraphasic error  Memory: good recent and remote recall  CN: PERRL, EOMI no nystagmus, VFF to FC bilat, unable to fully visualize fundus due to pupil size, no ptosis, sensation intact to LT V1-V3 bilat, face symmetric, no weakness, hearing grossly intact, palate elevates symmetrically, shoulder shrug 5/5 bilat,  tongue protrudes midline, no  fasiculations noted.  Motor: normal bulk and tone Strength: 5/5  In all extremities  Coord: rapid alternating and point-to-point (FNF, HTS) movements intact.  Reflexes: symmetrical, bilat downgoing toes  Sens: LT intact in all extremities  Gait: posture, stance, stride and arm-swing normal. Tandem gait intact. Able to walk on heels and toes. Romberg absent.   Assessment:  After physical and neurologic examination, review of laboratory studies, imaging, neurophysiology testing and pre-existing records, assessment will be reviewed on the problem list.  Plan:  Treatment plan and additional workup will be reviewed under Problem List.  1)Migraine headache  27y/o woman presenting for initial evaluation of headaches consistent with a diagnosis of migraine headache. She reports being on topamax in the past and only having one headache a month while on this medication. She reports stopping because she was considering pregnancy and also because she did not feel it was working since she continued to have one headache a month. As she is still considering pregnancy I counseled her that her options are limited. Explained that for abortive agents I would limit her to tylenol during pregnancy. Gave her the option of daily magnesium or riboflavin for preventive therapy. Counseled her that there is no medication that can completely eliminate her headaches. She does not wish to start a medication at this time if she may still have breakthrough headaches. Counseled her on importance of good sleep hygiene, keeping a headache diary and avoiding triggers. She expressed understanding. Follow up as needed.   Elspeth ChoPeter Zandyr Barnhill, DO  [facility] [address] [place] [id number]  Phone [phone] Fax [phone]

## 2014-09-27 ENCOUNTER — Ambulatory Visit (INDEPENDENT_AMBULATORY_CARE_PROVIDER_SITE_OTHER): Payer: BLUE CROSS/BLUE SHIELD | Admitting: Family Medicine

## 2014-09-27 ENCOUNTER — Encounter: Payer: Self-pay | Admitting: Family Medicine

## 2014-09-27 VITALS — BP 112/82 | HR 74 | Temp 98.0°F | Ht 68.0 in | Wt 167.2 lb

## 2014-09-27 DIAGNOSIS — J329 Chronic sinusitis, unspecified: Secondary | ICD-10-CM

## 2014-09-27 MED ORDER — AMOXICILLIN-POT CLAVULANATE 875-125 MG PO TABS: 1.0000 | ORAL_TABLET | Freq: Two times a day (BID) | ORAL | Status: DC

## 2014-09-27 NOTE — Progress Notes (Signed)
HPI:  Sinus congestion: -started: 3 weeks ago -symptoms:nasal congestion, sore throat, cough - seemed to resolve for 5 days, then worse with sinus pain and thick yellow mucus, now improving again -denies:fever, SOB, NVD, tooth pain -has tried: tylenol cold medication OTC -sick contacts/travel/risks: denies flu exposure, tick exposure or or Ebola risks   ROS: See pertinent positives and negatives per HPI.  Past Medical History  Diagnosis Date  . Kidney stone   . Migraine     Past Surgical History  Procedure Laterality Date  . Knee surgery  2008, 2009, 2010    Family History  Problem Relation Age of Onset  . Arthritis Mother   . Hyperlipidemia Father   . Heart disease Father   . Cancer      prostate/grandfather    History   Social History  . Marital Status: Single    Spouse Name: N/A    Number of Children: 0  . Years of Education: 12   Occupational History  .     Social History Main Topics  . Smoking status: Never Smoker   . Smokeless tobacco: Never Used  . Alcohol Use: Yes     Comment: rare  . Drug Use: No  . Sexual Activity: None   Other Topics Concern  . None   Social History Narrative   Patient lives at home with Husband Loraine LericheMark.   Patient has no children    Patient works at the [employer].    Patient has a Scientist, water qualityMasters          Current outpatient prescriptions: aspirin-acetaminophen-caffeine (EXCEDRIN MIGRAINE) 250-250-65 MG per tablet, Take 1 tablet by mouth every 6 (six) hours as needed for headache., Disp: , Rfl:   EXAM:  Filed Vitals:   09/27/14 1614  BP: 112/82  Pulse: 74  Temp: 98 F (36.7 C)    Body mass index is 25.43 kg/(m^2).  GENERAL: vitals reviewed and listed above, alert, oriented, appears well hydrated and in no acute distress  HEENT: atraumatic, conjunttiva clear, no obvious abnormalities on inspection of external nose and ears, normal appearance of ear canals and TMs, clear nasal congestion, mild post  oropharyngeal erythema with PND, no tonsillar edema or exudate, no sinus TTP  NECK: no obvious masses on inspection  LUNGS: clear to auscultation bilaterally, no wheezes, rales or rhonchi, good air movement  CV: HRRR, no peripheral edema  MS: moves all extremities without noticeable abnormality  PSYCH: pleasant and cooperative, no obvious depression or anxiety  ASSESSMENT AND PLAN:  Discussed the following assessment and plan:  Sinusitis, unspecified chronicity, unspecified location   We discussed treatment side effects, likely course, antibiotic misuse, transmission, and signs of developing a serious illness. -she opted since feeling to try tx for presumed viral rhinosinusitis but has abx if worsening, sinus pain returns, not improving as expected  -of course, we advised to return or notify a doctor immediately if symptoms worsen or persist or new concerns arise.    Patient Instructions  INSTRUCTIONS FOR UPPER RESPIRATORY INFECTION:  -if sinus pain, worsening or not improving can start the antibiotic and complete the course - if you do not use please shred the prescription  -plenty of rest and fluids  -nasal saline wash 2-3 times daily (use prepackaged nasal saline or bottled/distilled water if making your own)   -can use AFRIN nasal spray for drainage and nasal congestion - but do NOT use longer then 3-4 days  -can use tylenol or ibuprofen as directed for  aches and sorethroat  -in the winter time, using a humidifier at night is helpful (please follow cleaning instructions)  -if you are taking a cough medication - use only as directed, may also try a teaspoon of honey to coat the throat and throat lozenges  -for sore throat, salt water gargles can help  -follow up if you have fevers, facial pain, tooth pain, difficulty breathing or are worsening or not getting better in 5-7 days      KIM, Dahlia ClientHANNAH R.

## 2014-09-27 NOTE — Patient Instructions (Signed)
INSTRUCTIONS FOR UPPER RESPIRATORY INFECTION:  -if sinus pain, worsening or not improving can start the antibiotic and complete the course - if you do not use please shred the prescription  -plenty of rest and fluids  -nasal saline wash 2-3 times daily (use prepackaged nasal saline or bottled/distilled water if making your own)   -can use AFRIN nasal spray for drainage and nasal congestion - but do NOT use longer then 3-4 days  -can use tylenol or ibuprofen as directed for aches and sorethroat  -in the winter time, using a humidifier at night is helpful (please follow cleaning instructions)  -if you are taking a cough medication - use only as directed, may also try a teaspoon of honey to coat the throat and throat lozenges  -for sore throat, salt water gargles can help  -follow up if you have fevers, facial pain, tooth pain, difficulty breathing or are worsening or not getting better in 5-7 days

## 2014-09-27 NOTE — Progress Notes (Signed)
Pre visit review using our clinic review tool, if applicable. No additional management support is needed unless otherwise documented below in the visit note. 

## 2014-11-28 ENCOUNTER — Inpatient Hospital Stay (HOSPITAL_COMMUNITY)
Admission: AD | Admit: 2014-11-28 | Discharge: 2014-11-28 | Disposition: A | Discharge status: Home / Self Care | Payer: BLUE CROSS/BLUE SHIELD | Source: Ambulatory Visit | Attending: Obstetrics | Admitting: Obstetrics

## 2014-11-28 ENCOUNTER — Encounter (HOSPITAL_COMMUNITY): Payer: Self-pay | Admitting: *Deleted

## 2014-11-28 ENCOUNTER — Inpatient Hospital Stay (HOSPITAL_COMMUNITY): Payer: BLUE CROSS/BLUE SHIELD

## 2014-11-28 DIAGNOSIS — Z3A11 11 weeks gestation of pregnancy: Secondary | ICD-10-CM | POA: Diagnosis not present

## 2014-11-28 DIAGNOSIS — O209 Hemorrhage in early pregnancy, unspecified: Secondary | ICD-10-CM

## 2014-11-28 DIAGNOSIS — O034 Incomplete spontaneous abortion without complication: Secondary | ICD-10-CM | POA: Insufficient documentation

## 2014-11-28 DIAGNOSIS — O200 Threatened abortion: Secondary | ICD-10-CM | POA: Clinically undetermined

## 2014-11-28 HISTORY — DX: Threatened abortion: O20.0

## 2014-11-28 LAB — CBC
HCT: 40.1 % (ref 36.0–46.0)
HEMOGLOBIN: 14 g/dL (ref 12.0–15.0)
MCH: 31.7 pg (ref 26.0–34.0)
MCHC: 34.9 g/dL (ref 30.0–36.0)
MCV: 90.7 fL (ref 78.0–100.0)
Platelets: 213 10*3/uL (ref 150–400)
RBC: 4.42 MIL/uL (ref 3.87–5.11)
RDW: 12 % (ref 11.5–15.5)
WBC: 6.4 10*3/uL (ref 4.0–10.5)

## 2014-11-28 LAB — HCG, QUANTITATIVE, PREGNANCY: hCG, Beta Chain, Quant, S: 13120 m[IU]/mL — ABNORMAL HIGH (ref <5)

## 2014-11-28 MED ORDER — PROMETHAZINE HCL 12.5 MG PO TABS: 12.5000 mg | ORAL_TABLET | Freq: Four times a day (QID) | ORAL | Status: DC | PRN

## 2014-11-28 MED ORDER — IBUPROFEN 600 MG PO TABS: 600.0000 mg | ORAL_TABLET | Freq: Four times a day (QID) | ORAL | Status: DC | PRN

## 2014-11-28 MED ORDER — IBUPROFEN 600 MG PO TABS: 600.0000 mg | ORAL_TABLET | Freq: Once | ORAL | Status: DC

## 2014-11-28 NOTE — MAU Provider Note (Signed)
History     CSN: [id number]  Arrival date and time: 11/28/14 4098  Provider notified: 2021 Provider on unit: 2015 Provider at bedside: 2120     Chief Complaint  Patient presents with  . Vaginal Bleeding  . Miscarriage   HPI  Ms. Elizabeth Washington is a 29 yo G1P0 at 11.4 wks by LMP presents tonight with complaints of lots of heavy vaginal bleeding that started about 1600 today.  She reports lots of cramping, passing large clots and saturating through 2 pads in 30 mins. The cramping stopped after the passing of large clots earlier. She was seen by Dr. Seymour Bars in the office 2/5 and dx'd with a 6 wks MAB.  She was planning a D&E on 2/12, if miscarriage had not passed before then.  Past Medical History  Diagnosis Date  . Migraine   . Kidney stone   . Miscarriage, threatened, early pregnancy 11/28/2014    Past Surgical History  Procedure Laterality Date  . Knee surgery  2008, 2009, 2010    Family History  Problem Relation Age of Onset  . Arthritis Mother   . Hyperlipidemia Father   . Heart disease Father   . Cancer      prostate/grandfather    History  Substance Use Topics  . Smoking status: Never Smoker   . Smokeless tobacco: Never Used  . Alcohol Use: Yes     Comment: rare    Allergies: No Known Allergies  Prescriptions prior to admission  Medication Sig Dispense Refill Last Dose  . ibuprofen (ADVIL,MOTRIN) 200 MG tablet Take 600 mg by mouth every 6 (six) hours as needed for moderate pain.   11/28/2014 at Unknown time  . Prenatal Vit-Fe Fumarate-FA (PRENATAL MULTIVITAMIN) TABS tablet Take 1 tablet by mouth daily at 12 noon.   11/28/2014 at Unknown time  . amoxicillin-clavulanate (AUGMENTIN) 875-125 MG per tablet Take 1 tablet by mouth 2 (two) times daily. (Patient not taking: Reported on 11/28/2014) 20 tablet 0     Review of Systems  Constitutional: Negative.   Eyes: Negative.   Cardiovascular: Negative.   Gastrointestinal:       Mild cramping around 1600   Genitourinary:       Heavy vaginal bleeding since 1600   Skin: Negative.   Neurological: Negative.   Endo/Heme/Allergies: Negative.   Psychiatric/Behavioral: Negative.    Results for orders placed or performed during the hospital encounter of 11/28/14 (from the past 24 hour(s))  CBC     Status: None   Collection Time: 11/28/14  7:00 PM  Result Value Ref Range   WBC 6.4 4.0 - 10.5 K/uL   RBC 4.42 3.87 - 5.11 MIL/uL   Hemoglobin 14.0 12.0 - 15.0 g/dL   HCT 11.9 14.7 - 82.9 %   MCV 90.7 78.0 - 100.0 fL   MCH 31.7 26.0 - 34.0 pg   MCHC 34.9 30.0 - 36.0 g/dL   RDW 56.2 13.0 - 86.5 %   Platelets 213 150 - 400 K/uL  hCG, quantitative, pregnancy     Status: Abnormal   Collection Time: 11/28/14  7:00 PM  Result Value Ref Range   hCG, Beta Chain, Quant, S 13120 (H) <5 mIU/mL   US Ob Comp Less 14 Wks  11/28/2014   CLINICAL DATA:  First trimester pregnancy with heavy vaginal bleeding  EXAM: OBSTETRIC <14 WK [place] AND TRANSVAGINAL OB US  TECHNIQUE: Both transabdominal and transvaginal ultrasound examinations were performed for complete evaluation of the gestation as well as the maternal  uterus, adnexal regions, and pelvic cul-de-sac. Transvaginal technique was performed to assess early pregnancy.  COMPARISON:  None.  FINDINGS: Intrauterine gestational sac: A normal intrauterine gestational sac is not identified  Yolk sac:  None  Embryo:  None  Cardiac Activity: None  Heart Rate: 0  bpm  Maternal uterus/adnexae: There is a complex hypoechoic fluid collection in the right upper endometrial canal, measuring about 16 mm. It does not demonstrate landmarks of an intrauterine gestational sac, and it changes appearance during scanning with internal movement of echoes consistent with debris and hemorrhage.  No subchorionic hemorrhage within the uterus. Ovaries are normal. No free fluid in the pelvis.  IMPRESSION: The patient states that she has suffered a known of missed abortion based on ultrasound performed in  her referring physician's office. The findings would appear to confirm that, with hemorrhage within the endometrial canal but no identified normal gestational sac.   Electronically Signed   By: Esperanza Heiraymond  Rubner M.D.   On: 11/28/2014 20:14    Physical Exam   Blood pressure 132/83, pulse 77, temperature 97.9 F (36.6 C), temperature source Oral, resp. rate 18, height 5\' 7"  (1.702 m), weight 78.019 kg (172 lb).  Physical Exam  Constitutional: She is oriented to person, place, and time. She appears well-developed and well-nourished.  HENT:  Head: Normocephalic and atraumatic.  Eyes: Pupils are equal, round, and reactive to light.  Neck: Normal range of motion.  Cardiovascular: Normal rate, regular rhythm and normal heart sounds.   Respiratory: Effort normal and breath sounds normal.  GI: Soft. Bowel sounds are normal.  Genitourinary:  No adnexal tenderness or masses; no CMT; small amount of dark, red blood cleaned from vaginal vault, no active bleeding from cervical os; 2 small clots noted on peripad  Musculoskeletal: Normal range of motion.  Neurological: She is alert and oriented to person, place, and time. She has normal reflexes.  Skin: Skin is warm and dry.  Psychiatric: She has a normal mood and affect. Her behavior is normal. Judgment and thought content normal.    MAU Course  Procedures CBC HCG  Assessment and Plan  No IUP seen on U/S Miscarriage, incomplete  Discharge home Rx: Ibuprofen 600 mg po every 6 hours prn mild to moderate pain       Phenergan 12.5 mg po every 4-6 hours prn N/V Follow-up in office in 1 wk Bleeding precautions given Call the office, if sx's worsen  Kenard GowerDAWSON, Geneive Sandstrom, M MSN, CNM 11/28/2014, 9:35 PM

## 2014-11-28 NOTE — Discharge Instructions (Signed)
Pelvic Rest - until all vaginal bleeding has stopped Pelvic rest is sometimes recommended for women when:   The placenta is partially or completely covering the opening of the cervix (placenta previa).  There is bleeding between the uterine wall and the amniotic sac in the first trimester (subchorionic hemorrhage).  The cervix begins to open without labor starting (incompetent cervix, cervical insufficiency).  The labor is too early (preterm labor). HOME CARE INSTRUCTIONS  Do not have sexual intercourse, stimulation, or an orgasm.  Do not use tampons, douche, or put anything in the vagina.  Do not lift anything over 10 pounds (4.5 kg).  Avoid strenuous activity or straining your pelvic muscles. SEEK MEDICAL CARE IF:  You have any vaginal bleeding during pregnancy. Treat this as a potential emergency.  You have cramping pain felt low in the stomach (stronger than menstrual cramps).  You notice vaginal discharge (watery, mucus, or bloody).  You have a low, dull backache.  There are regular contractions or uterine tightening. SEEK IMMEDIATE MEDICAL CARE IF: You have vaginal bleeding and have placenta previa.  Document Released: 02/02/2011 Document Revised: 12/31/2011 Document Reviewed: 02/02/2011 [facility] CenterExit[facility] Patient Information 2015 [place] Center[place], [place]. This information is not intended to replace advice given to you by your health care provider. Make sure you discuss any questions you have with your health care provider. Miscarriage A miscarriage is the sudden loss of an unborn baby (fetus) before the 20th week of pregnancy. Most miscarriages happen in the first 3 months of pregnancy. Sometimes, it happens before a woman even knows she is pregnant. A miscarriage is also called a "spontaneous miscarriage" or "early pregnancy loss." Having a miscarriage can be an emotional experience. Talk with your caregiver about any questions you may have about miscarrying, the grieving process, and your  future pregnancy plans. CAUSES   Problems with the fetal chromosomes that make it impossible for the baby to develop normally. Problems with the baby's genes or chromosomes are most often the result of errors that occur, by chance, as the embryo divides and grows. The problems are not inherited from the parents.  Infection of the cervix or uterus.   Hormone problems.   Problems with the cervix, such as having an incompetent cervix. This is when the tissue in the cervix is not strong enough to hold the pregnancy.   Problems with the uterus, such as an abnormally shaped uterus, uterine fibroids, or congenital abnormalities.   Certain medical conditions.   Smoking, drinking alcohol, or taking illegal drugs.   Trauma.  Often, the cause of a miscarriage is unknown.  SYMPTOMS   Vaginal bleeding or spotting, with or without cramps or pain.  Pain or cramping in the abdomen or lower back.  Passing fluid, tissue, or blood clots from the vagina. DIAGNOSIS  Your caregiver will perform a physical exam. You may also have an ultrasound to confirm the miscarriage. Blood or urine tests may also be ordered. TREATMENT   Sometimes, treatment is not necessary if you naturally pass all the fetal tissue that was in the uterus. If some of the fetus or placenta remains in the body (incomplete miscarriage), tissue left behind may become infected and must be removed. Usually, a dilation and curettage (D and C) procedure is performed. During a D and C procedure, the cervix is widened (dilated) and any remaining fetal or placental tissue is gently removed from the uterus.  Antibiotic medicines are prescribed if there is an infection. Other medicines may be given to reduce the  size of the uterus (contract) if there is a lot of bleeding.  If you have Rh negative blood and your baby was Rh positive, you will need a Rh immunoglobulin shot. This shot will protect any future baby from having Rh blood problems  in future pregnancies. HOME CARE INSTRUCTIONS   Your caregiver may order bed rest or may allow you to continue light activity. Resume activity as directed by your caregiver.  Have someone help with home and family responsibilities during this time.   Keep track of the number of sanitary pads you use each day and how soaked (saturated) they are. Write down this information.   Do not use tampons. Do not douche or have sexual intercourse until approved by your caregiver.   Only take over-the-counter or prescription medicines for pain or discomfort as directed by your caregiver.   Do not take aspirin. Aspirin can cause bleeding.   Keep all follow-up appointments with your caregiver.   If you or your partner have problems with grieving, talk to your caregiver or seek counseling to help cope with the pregnancy loss. Allow enough time to grieve before trying to get pregnant again.  SEEK IMMEDIATE MEDICAL CARE IF:   You have severe cramps or pain in your back or abdomen.  You have a fever.  You pass large blood clots (walnut-sized or larger) ortissue from your vagina. Save any tissue for your caregiver to inspect.   Your bleeding increases.   You have a thick, bad-smelling vaginal discharge.  You become lightheaded, weak, or you faint.   You have chills.  MAKE SURE YOU:  Understand these instructions.  Will watch your condition.  Will get help right away if you are not doing well or get worse. Document Released: 04/03/2001 Document Revised: 02/02/2013 Document Reviewed: 11/27/2011 [facility] Patient Information 2015 [place], [place]. This information is not intended to replace advice given to you by your health care provider. Make sure you discuss any questions you have with your health care provider.

## 2014-11-28 NOTE — MAU Note (Signed)
Passed a few clots then noticed to have soaked through 2 pads in half hour

## 2014-11-29 ENCOUNTER — Other Ambulatory Visit: Payer: Self-pay | Admitting: Obstetrics & Gynecology

## 2014-12-03 ENCOUNTER — Ambulatory Visit: Admit: 2014-12-03 | Payer: Self-pay | Admitting: Obstetrics & Gynecology

## 2014-12-03 SURGERY — DILATION AND EVACUATION, UTERUS
Anesthesia: Choice

## 2015-05-04 ENCOUNTER — Encounter: Payer: Self-pay | Admitting: Family Medicine

## 2015-07-08 ENCOUNTER — Inpatient Hospital Stay (HOSPITAL_COMMUNITY)
Admission: AD | Admit: 2015-07-08 | Discharge: 2015-07-08 | Disposition: A | Discharge status: Home / Self Care | Payer: 59 | Source: Ambulatory Visit | Attending: Obstetrics and Gynecology | Admitting: Obstetrics and Gynecology

## 2015-07-08 ENCOUNTER — Encounter (HOSPITAL_COMMUNITY): Payer: Self-pay

## 2015-07-08 DIAGNOSIS — Z3A08 8 weeks gestation of pregnancy: Secondary | ICD-10-CM | POA: Insufficient documentation

## 2015-07-08 DIAGNOSIS — Z87442 Personal history of urinary calculi: Secondary | ICD-10-CM | POA: Diagnosis not present

## 2015-07-08 DIAGNOSIS — O210 Mild hyperemesis gravidarum: Secondary | ICD-10-CM | POA: Insufficient documentation

## 2015-07-08 DIAGNOSIS — R112 Nausea with vomiting, unspecified: Secondary | ICD-10-CM | POA: Diagnosis present

## 2015-07-08 DIAGNOSIS — O219 Vomiting of pregnancy, unspecified: Secondary | ICD-10-CM

## 2015-07-08 LAB — COMPREHENSIVE METABOLIC PANEL
ALT: 18 U/L (ref 14–54)
AST: 17 U/L (ref 15–41)
Albumin: 4.2 g/dL (ref 3.5–5.0)
Alkaline Phosphatase: 39 U/L (ref 38–126)
Anion gap: 7 (ref 5–15)
BUN: 12 mg/dL (ref 6–20)
CHLORIDE: 105 mmol/L (ref 101–111)
CO2: 23 mmol/L (ref 22–32)
CREATININE: 0.67 mg/dL (ref 0.44–1.00)
Calcium: 8.7 mg/dL — ABNORMAL LOW (ref 8.9–10.3)
Glucose, Bld: 83 mg/dL (ref 65–99)
POTASSIUM: 4.3 mmol/L (ref 3.5–5.1)
SODIUM: 135 mmol/L (ref 135–145)
Total Bilirubin: 0.5 mg/dL (ref 0.3–1.2)
Total Protein: 7 g/dL (ref 6.5–8.1)

## 2015-07-08 LAB — URINALYSIS, ROUTINE W REFLEX MICROSCOPIC
Bilirubin Urine: NEGATIVE
GLUCOSE, UA: NEGATIVE mg/dL
Hgb urine dipstick: NEGATIVE
Leukocytes, UA: NEGATIVE
Nitrite: NEGATIVE
PROTEIN: NEGATIVE mg/dL
Specific Gravity, Urine: >1.03 — ABNORMAL HIGH (ref 1.005–1.030)
Urobilinogen, UA: 1 mg/dL (ref 0.0–1.0)
pH: 6 (ref 5.0–8.0)

## 2015-07-08 MED ORDER — LACTATED RINGERS IV BOLUS (SEPSIS)
1000.0000 mL | Freq: Once | INTRAVENOUS | Status: AC
  Administered 2015-07-08: 1000 mL via INTRAVENOUS

## 2015-07-08 MED ORDER — PROMETHAZINE HCL 25 MG/ML IJ SOLN
25.0000 mg | Freq: Once | INTRAMUSCULAR | Status: AC
  Administered 2015-07-08: 25 mg via INTRAVENOUS
  Filled 2015-07-08: qty 1

## 2015-07-08 MED ORDER — PROMETHAZINE HCL 25 MG PO TABS: 25.0000 mg | ORAL_TABLET | Freq: Four times a day (QID) | ORAL | Status: DC | PRN

## 2015-07-08 NOTE — MAU Note (Signed)
Pt here from MD office for hyperemesis. To get iv fluids. Has not taken any n/v meds.

## 2015-07-08 NOTE — MAU Provider Note (Signed)
MAU HISTORY AND PHYSICAL  Chief Complaint:  Nausea/vomiting  Had ultrasound on Tuesday, had heart rate, read as normal per patient.  Symptoms began two days ago, though has had some nausea for a couple of weeks. Trouble keeping down food and liquids. Urinated three times today. Prescribed b6 and doxylamine today. No abdominal pain, no diarrhea, no fever or chills. No dysuria, no back pain, no urinary frequency. No sick contacts. No vaginal bleeding, no pelvic pain or cramps.     Past Medical History  Diagnosis Date  . Migraine   . Kidney stone   . Miscarriage, threatened, early pregnancy 11/28/2014    Past Surgical History  Procedure Laterality Date  . Knee surgery  2008, 2009, 2010    Family History  Problem Relation Age of Onset  . Arthritis Mother   . Hyperlipidemia Father   . Heart disease Father   . Cancer      prostate/grandfather    Social History  Substance Use Topics  . Smoking status: Never Smoker   . Smokeless tobacco: Never Used  . Alcohol Use: Yes     Comment: rare    No Known Allergies  Prescriptions prior to admission  Medication Sig Dispense Refill Last Dose  . ibuprofen (ADVIL,MOTRIN) 200 MG tablet Take 600 mg by mouth every 6 (six) hours as needed for moderate pain.   11/28/2014 at Unknown time  . ibuprofen (ADVIL,MOTRIN) 600 MG tablet Take 1 tablet (600 mg total) by mouth every 6 (six) hours as needed for mild pain or moderate pain. 30 tablet 0   . Prenatal Vit-Fe Fumarate-FA (PRENATAL MULTIVITAMIN) TABS tablet Take 1 tablet by mouth daily at 12 noon.   11/28/2014 at Unknown time  . promethazine (PHENERGAN) 12.5 MG tablet Take 1 tablet (12.5 mg total) by mouth every 6 (six) hours as needed for nausea or vomiting. 30 tablet 0     Review of Systems - Negative except for what is mentioned in HPI.  Physical Exam  Blood pressure 117/73, pulse 70, temperature 97.5 F (36.4 C), temperature source Oral, resp. rate 16, height 5\' 8"  (1.727 m), weight 165 lb 4  oz (74.957 kg). GENERAL: Well-developed, well-nourished female in no acute distress.  HEENT: MMM LUNGS: Clear to auscultation bilaterally.  HEART: Regular rate and rhythm. ABDOMEN: Soft, nontender, nondistended, gravid.  EXTREMITIES: Nontender, no edema, 2+ distal pulses.    Labs: Results for orders placed or performed during the hospital encounter of 07/08/15 (from the past 24 hour(s))  Urinalysis, Routine w reflex microscopic (not at [facility])   Collection Time: 07/08/15  5:00 PM  Result Value Ref Range   Color, Urine AMBER (A) YELLOW   APPearance CLEAR CLEAR   Specific Gravity, Urine >1.030 (H) 1.005 - 1.030   pH 6.0 5.0 - 8.0   Glucose, UA NEGATIVE NEGATIVE mg/dL   Hgb urine dipstick NEGATIVE NEGATIVE   Bilirubin Urine NEGATIVE NEGATIVE   Ketones, ur >80 (A) NEGATIVE mg/dL   Protein, ur NEGATIVE NEGATIVE mg/dL   Urobilinogen, UA 1.0 0.0 - 1.0 mg/dL   Nitrite NEGATIVE NEGATIVE   Leukocytes, UA NEGATIVE NEGATIVE    Imaging Studies:  No results found.  Assessment: Elizabeth Washington is  29 y.o. G2P0010 at 108w5d presents with nausea/vomiting of pregnancy. No tachycardia or dry mucous membranes on exam, but significant ketones in urine. CMP unremarkable, no abdominal pain or constitutional symptoms to suggest etiology other than n/v of pregnancy. Urine not suggestive of infection. Recent u/s per patient normal, so minimal  concern for gestational trophic disease and twin pregnancy. Have given phenergan and 2 L LR and patient feeling much better, tolerated fluids, and has had no vomiting and significant reduction of nausea.  Plan: - home with push po fluids, oral phenergan, and ob f/u this coming week, with dehydration return precautions.  Elizabeth Washington 9/16/20165:35 PM

## 2015-07-08 NOTE — Discharge Instructions (Signed)

## 2015-07-11 LAB — OB RESULTS CONSOLE GC/CHLAMYDIA
CHLAMYDIA, DNA PROBE: NEGATIVE
Gonorrhea: NEGATIVE

## 2015-07-11 LAB — OB RESULTS CONSOLE HEPATITIS B SURFACE ANTIGEN: Hepatitis B Surface Ag: NEGATIVE

## 2015-07-11 LAB — OB RESULTS CONSOLE ABO/RH: RH Type: POSITIVE

## 2015-07-11 LAB — OB RESULTS CONSOLE RPR: RPR: NONREACTIVE

## 2015-07-11 LAB — OB RESULTS CONSOLE RUBELLA ANTIBODY, IGM: RUBELLA: NON-IMMUNE/NOT IMMUNE

## 2015-07-11 LAB — OB RESULTS CONSOLE ANTIBODY SCREEN: Antibody Screen: NEGATIVE

## 2015-07-11 LAB — OB RESULTS CONSOLE HIV ANTIBODY (ROUTINE TESTING): HIV: NONREACTIVE

## 2015-07-20 ENCOUNTER — Inpatient Hospital Stay (HOSPITAL_COMMUNITY)
Admission: AD | Admit: 2015-07-20 | Discharge: 2015-07-20 | Disposition: A | Discharge status: Home / Self Care | Payer: Managed Care, Other (non HMO) | Source: Ambulatory Visit | Attending: Obstetrics and Gynecology | Admitting: Obstetrics and Gynecology

## 2015-07-20 ENCOUNTER — Encounter (HOSPITAL_COMMUNITY): Payer: Self-pay | Admitting: *Deleted

## 2015-07-20 DIAGNOSIS — O219 Vomiting of pregnancy, unspecified: Secondary | ICD-10-CM | POA: Diagnosis not present

## 2015-07-20 DIAGNOSIS — O26891 Other specified pregnancy related conditions, first trimester: Secondary | ICD-10-CM | POA: Insufficient documentation

## 2015-07-20 DIAGNOSIS — R112 Nausea with vomiting, unspecified: Secondary | ICD-10-CM | POA: Diagnosis present

## 2015-07-20 DIAGNOSIS — E860 Dehydration: Secondary | ICD-10-CM

## 2015-07-20 DIAGNOSIS — Z3A10 10 weeks gestation of pregnancy: Secondary | ICD-10-CM | POA: Diagnosis not present

## 2015-07-20 LAB — URINALYSIS, ROUTINE W REFLEX MICROSCOPIC
Bilirubin Urine: NEGATIVE
Glucose, UA: NEGATIVE mg/dL
Hgb urine dipstick: NEGATIVE
Ketones, ur: >80 mg/dL — AB
LEUKOCYTES UA: NEGATIVE
NITRITE: NEGATIVE
PROTEIN: NEGATIVE mg/dL
Specific Gravity, Urine: 1.015 (ref 1.005–1.030)
Urobilinogen, UA: 0.2 mg/dL (ref 0.0–1.0)
pH: 6.5 (ref 5.0–8.0)

## 2015-07-20 MED ORDER — PROMETHAZINE HCL 25 MG/ML IJ SOLN
25.0000 mg | Freq: Once | INTRAMUSCULAR | Status: AC
  Administered 2015-07-20: 25 mg via INTRAVENOUS
  Filled 2015-07-20: qty 1

## 2015-07-20 MED ORDER — M.V.I. ADULT IV INJ
INJECTION | Freq: Once | INTRAVENOUS | Status: AC
  Administered 2015-07-20: 15:00:00 via INTRAVENOUS
  Filled 2015-07-20: qty 10

## 2015-07-20 MED ORDER — METOCLOPRAMIDE HCL 10 MG PO TABS: 10.0000 mg | ORAL_TABLET | Freq: Three times a day (TID) | ORAL | Status: DC | PRN

## 2015-07-20 NOTE — Discharge Instructions (Signed)
Dehydration, Adult Dehydration is when you lose more fluids from the body than you take in. Vital organs like the kidneys, brain, and heart cannot function without a proper amount of fluids and salt. Any loss of fluids from the body can cause dehydration.  CAUSES   Vomiting.  Diarrhea.  Excessive sweating.  Excessive urine output.  Fever. SYMPTOMS  Mild dehydration  Thirst.  Dry lips.  Slightly dry mouth. Moderate dehydration  Very dry mouth.  Sunken eyes.  Skin does not bounce back quickly when lightly pinched and released.  Dark urine and decreased urine production.  Decreased tear production.  Headache. Severe dehydration  Very dry mouth.  Extreme thirst.  Rapid, weak pulse (more than 100 beats per minute at rest).  Cold hands and feet.  Not able to sweat in spite of heat and temperature.  Rapid breathing.  Blue lips.  Confusion and lethargy.  Difficulty being awakened.  Minimal urine production.  No tears. DIAGNOSIS  Your caregiver will diagnose dehydration based on your symptoms and your exam. Blood and urine tests will help confirm the diagnosis. The diagnostic evaluation should also identify the cause of dehydration. TREATMENT  Treatment of mild or moderate dehydration can often be done at home by increasing the amount of fluids that you drink. It is best to drink small amounts of fluid more often. Drinking too much at one time can make vomiting worse. Refer to the home care instructions below. Severe dehydration needs to be treated at the hospital where you will probably be given intravenous (IV) fluids that contain water and electrolytes. HOME CARE INSTRUCTIONS   Ask your caregiver about specific rehydration instructions.  Drink enough fluids to keep your urine clear or pale yellow.  Drink small amounts frequently if you have nausea and vomiting.  Eat as you normally do.  Avoid:  Foods or drinks high in sugar.  Carbonated  drinks.  Juice.  Extremely hot or cold fluids.  Drinks with caffeine.  Fatty, greasy foods.  Alcohol.  Tobacco.  Overeating.  Gelatin desserts.  Wash your hands well to avoid spreading bacteria and viruses.  Only take over-the-counter or prescription medicines for pain, discomfort, or fever as directed by your caregiver.  Ask your caregiver if you should continue all prescribed and over-the-counter medicines.  Keep all follow-up appointments with your caregiver. SEEK MEDICAL CARE IF:  You have abdominal pain and it increases or stays in one area (localizes).  You have a rash, stiff neck, or severe headache.  You are irritable, sleepy, or difficult to awaken.  You are weak, dizzy, or extremely thirsty. SEEK IMMEDIATE MEDICAL CARE IF:   You are unable to keep fluids down or you get worse despite treatment.  You have frequent episodes of vomiting or diarrhea.  You have blood or green matter (bile) in your vomit.  You have blood in your stool or your stool looks black and tarry.  You have not urinated in 6 to 8 hours, or you have only urinated a small amount of very dark urine.  You have a fever.  You faint. MAKE SURE YOU:   Understand these instructions.  Will watch your condition.  Will get help right away if you are not doing well or get worse. Document Released: 10/08/2005 Document Revised: 12/31/2011 Document Reviewed: 05/28/2011 [facility] Patient Information 2015 [place], [place]. This information is not intended to replace advice given to you by your health care provider. Make sure you discuss any questions you have with your health care  provider. °Morning Sickness °Morning sickness is when you feel sick to your stomach (nauseous) during pregnancy. This nauseous feeling may or may not come with vomiting. It often occurs in the morning but can be a problem any time of day. Morning sickness is most common during the first trimester, but it may continue  throughout pregnancy. While morning sickness is unpleasant, it is usually harmless unless you develop severe and continual vomiting (hyperemesis gravidarum). This condition requires more intense treatment.  °CAUSES  °The cause of morning sickness is not completely known but seems to be related to normal hormonal changes that occur in pregnancy. °RISK FACTORS °You are at greater risk if you: °· Experienced nausea or vomiting before your pregnancy. °· Had morning sickness during a previous pregnancy. °· Are pregnant with more than one baby, such as twins. °TREATMENT  °Do not use any medicines (prescription, over-the-counter, or herbal) for morning sickness without first talking to your health care provider. Your health care provider may prescribe or recommend: °· Vitamin B6 supplements. °· Anti-nausea medicines. °· The herbal medicine ginger. °HOME CARE INSTRUCTIONS  °· Only take over-the-counter or prescription medicines as directed by your health care provider. °· Taking multivitamins before getting pregnant can prevent or decrease the severity of morning sickness in most women. °· Eat a piece of dry toast or unsalted crackers before getting out of bed in the morning. °· Eat five or six small meals a day. °· Eat dry and bland foods (rice, baked potato). Foods high in carbohydrates are often helpful. °· Do not drink liquids with your meals. Drink liquids between meals. °· Avoid greasy, fatty, and spicy foods. °· Get someone to cook for you if the smell of any food causes nausea and vomiting. °· If you feel nauseous after taking prenatal vitamins, take the vitamins at night or with a snack.  °· Snack on protein foods (nuts, yogurt, cheese) between meals if you are hungry. °· Eat unsweetened gelatins for desserts. °· Wearing an acupressure wristband (worn for sea sickness) may be helpful. °· Acupuncture may be helpful. °· Do not smoke. °· Get a humidifier to keep the air in your house free of odors. °· Get plenty of  fresh air. °SEEK MEDICAL CARE IF:  °· Your home remedies are not working, and you need medicine. °· You feel dizzy or lightheaded. °· You are losing weight. °SEEK IMMEDIATE MEDICAL CARE IF:  °· You have persistent and uncontrolled nausea and vomiting. °· You pass out (faint). °MAKE SURE YOU: °· Understand these instructions. °· Will watch your condition. °· Will get help right away if you are not doing well or get worse. °Document Released: 11/29/2006 Document Revised: 10/13/2013 Document Reviewed: 03/25/2013 °ExitCare® Patient Information ©2015 [employer]. This information is not intended to replace advice given to you by your health care provider. Make sure you discuss any questions you have with your health care provider. ° °

## 2015-07-20 NOTE — MAU Note (Addendum)
Ongoing vomiting. Stuff she is taking at home, just really isn't working; throws up every day. Kept her phenergan down this morning- then took extra strength Tylenol for migraine- was able to keep that down.

## 2015-07-20 NOTE — MAU Provider Note (Signed)
History     CSN: [id number]  Arrival date and time: 07/20/15 1127   First Ravis Herne Initiated Contact with Patient 07/20/15 1321      Chief Complaint  Patient presents with  . Emesis During Pregnancy  . Headache   HPI RIA REDCAY 29 y.o. G2P0010  presents to MAU complaining of nausea, vomiting up to 10-11 times per day.  She has been using Diclegis but continued to vomit with its use.  She is also using Phenergan and that is helpful for a few hours.  Last taken at 9:30am today.  She reports feeling weak and is concerned for dehydration.  She denies LOF, VB, dysuria, fever.  She is not yet feeling fetal movement.   OB History    Gravida Para Term Preterm AB TAB SAB Ectopic Multiple Living   Past Medical History  Diagnosis Date  . Migraine   . Kidney stone   . Miscarriage, threatened, early pregnancy 11/28/2014    Past Surgical History  Procedure Laterality Date  . Knee surgery  2008, 2009, 2010    Family History  Problem Relation Age of Onset  . Arthritis Mother   . Hyperlipidemia Father   . Heart disease Father   . Cancer      prostate/grandfather    Social History  Substance Use Topics  . Smoking status: Never Smoker   . Smokeless tobacco: Never Used  . Alcohol Use: Yes     Comment: rare    Allergies: No Known Allergies  Prescriptions prior to admission  Medication Sig Dispense Refill Last Dose  . acetaminophen (TYLENOL) 500 MG tablet Take 1,000 mg by mouth every 6 (six) hours as needed for headache.   07/20/2015 at Unknown time  . Doxylamine-Pyridoxine (DICLEGIS) 10-10 MG TBEC Take 2 tablets by mouth at bedtime as needed (nausea).    07/19/2015 at Unknown time  . Prenatal Vit-Fe Fumarate-FA (PRENATAL MULTIVITAMIN) TABS tablet Take 1 tablet by mouth daily at 12 noon.   Past Week at Unknown time  . promethazine (PHENERGAN) 25 MG tablet Take 1 tablet (25 mg total) by mouth every 6 (six) hours as needed for nausea or vomiting. 30  tablet 2 07/20/2015 at Unknown time    ROS Pertinent ROS in HPI.  All other systems are negative.   Physical Exam   Blood pressure 118/75, pulse 79, temperature 98.5 F (36.9 C), temperature source Oral, resp. rate 18, weight 163 lb 6.4 oz (74.118 kg).  Physical Exam  Constitutional: She is oriented to person, place, and time. She appears well-developed and well-nourished.  HENT:  Head: Normocephalic and atraumatic.  Eyes: EOM are normal.  Neck: Normal range of motion.  Cardiovascular: Normal rate.   Respiratory: No respiratory distress.  GI: Soft. There is no tenderness.  Musculoskeletal: Normal range of motion.  Neurological: She is alert and oriented to person, place, and time.  Skin: Skin is warm and dry.  Psychiatric: She has a normal mood and affect.   Results for orders placed or performed during the hospital encounter of 07/20/15 (from the past 24 hour(s))  Urinalysis, Routine w reflex microscopic (not at [facility])     Status: Abnormal   Collection Time: 07/20/15 12:07 PM  Result Value Ref Range   Color, Urine YELLOW YELLOW   APPearance CLEAR CLEAR   Specific Gravity, Urine 1.015 1.005 - 1.030   pH 6.5 5.0 - 8.0   Glucose,  UA NEGATIVE NEGATIVE mg/dL   Hgb urine dipstick NEGATIVE NEGATIVE   Bilirubin Urine NEGATIVE NEGATIVE   Ketones, ur >80 (A) NEGATIVE mg/dL   Protein, ur NEGATIVE NEGATIVE mg/dL   Urobilinogen, UA 0.2 0.0 - 1.0 mg/dL   Nitrite NEGATIVE NEGATIVE   Leukocytes, UA NEGATIVE NEGATIVE    MAU Course  Procedures  MDM Fetal heart tones heard on triage Ketone >80 IVfluids started: LR for 1 liter, then D5LR with MVI for 1 L Pt declines phenergan Dr. Mindi Slicker consulted.  She is in agreement with treatment plan for pt and discharge to home  Assessment and Plan  A:  1. Nausea/vomiting in pregnancy   2. Dehydration    P: Discharge to home Continue to push po fluids as much as able.  Continue Phenergan as able F/u in office as scheduled/PRN On discharge  - pt requests Reglan.  Will give limited supply and pt to check with OB Jalaya Sarver for refills as needed.  Patient may return to MAU as needed or if her condition were to change or worsen   Bertram Denver 07/20/2015, 1:22 PM

## 2015-08-30 ENCOUNTER — Inpatient Hospital Stay (HOSPITAL_COMMUNITY)
Admission: AD | Admit: 2015-08-30 | Discharge: 2015-08-30 | Disposition: A | Discharge status: Home / Self Care | Payer: 59 | Source: Ambulatory Visit | Attending: Obstetrics and Gynecology | Admitting: Obstetrics and Gynecology

## 2015-08-30 ENCOUNTER — Encounter (HOSPITAL_COMMUNITY): Payer: Self-pay | Admitting: *Deleted

## 2015-08-30 DIAGNOSIS — O26892 Other specified pregnancy related conditions, second trimester: Secondary | ICD-10-CM | POA: Diagnosis not present

## 2015-08-30 DIAGNOSIS — Z3A16 16 weeks gestation of pregnancy: Secondary | ICD-10-CM | POA: Diagnosis not present

## 2015-08-30 DIAGNOSIS — R51 Headache: Secondary | ICD-10-CM | POA: Insufficient documentation

## 2015-08-30 DIAGNOSIS — O210 Mild hyperemesis gravidarum: Secondary | ICD-10-CM | POA: Diagnosis not present

## 2015-08-30 DIAGNOSIS — G43709 Chronic migraine without aura, not intractable, without status migrainosus: Secondary | ICD-10-CM

## 2015-08-30 LAB — URINALYSIS, ROUTINE W REFLEX MICROSCOPIC
Bilirubin Urine: NEGATIVE
Glucose, UA: NEGATIVE mg/dL
Hgb urine dipstick: NEGATIVE
Ketones, ur: >80 mg/dL — AB
Leukocytes, UA: NEGATIVE
Nitrite: NEGATIVE
PH: 7 (ref 5.0–8.0)
PROTEIN: NEGATIVE mg/dL
SPECIFIC GRAVITY, URINE: 1.02 (ref 1.005–1.030)
UROBILINOGEN UA: 1 mg/dL (ref 0.0–1.0)

## 2015-08-30 MED ORDER — BUTALBITAL-APAP-CAFFEINE 50-325-40 MG PO CAPS: 1.0000 | ORAL_CAPSULE | Freq: Four times a day (QID) | ORAL | Status: DC | PRN

## 2015-08-30 MED ORDER — BUTALBITAL-APAP-CAFFEINE 50-325-40 MG PO TABS
2.0000 | ORAL_TABLET | Freq: Once | ORAL | Status: AC
  Administered 2015-08-30: 2 via ORAL
  Filled 2015-08-30: qty 2

## 2015-08-30 MED ORDER — PROMETHAZINE HCL 25 MG PO TABS
25.0000 mg | ORAL_TABLET | Freq: Four times a day (QID) | ORAL | Status: DC | PRN
Start: 2015-08-30 — End: 2016-02-11

## 2015-08-30 MED ORDER — PROMETHAZINE HCL 25 MG/ML IJ SOLN
25.0000 mg | Freq: Once | INTRAMUSCULAR | Status: AC
  Administered 2015-08-30: 25 mg via INTRAMUSCULAR
  Filled 2015-08-30: qty 1

## 2015-08-30 MED ORDER — CYCLOBENZAPRINE HCL 5 MG PO TABS: 10.0000 mg | ORAL_TABLET | Freq: Three times a day (TID) | ORAL | Status: DC | PRN

## 2015-08-30 NOTE — MAU Note (Signed)
IV start and FHR strip charted on this pt and then removed. Pt did not have EFM and no IV

## 2015-08-30 NOTE — MAU Note (Signed)
HA x 2 days, throbbing behind forehead, now sharp pain behind R eye.  Has been vomiting since last night, no diarrhea.  Feeling weak & dehydrated.  Denies abd pain or bleeding.

## 2015-08-30 NOTE — MAU Provider Note (Signed)
Chief Complaint: Headache and Emesis During Pregnancy  First Provider Initiated Contact with Patient 08/30/15 1656      SUBJECTIVE HPI: Elizabeth Washington is a 29 y.o. G2P0010 at [redacted]w[redacted]d who presents to Maternity Admissions reporting severe headache behind right eye similar in location and sensation of usual migraines, but much worse. PCO2 neurologist continues Topamax for migraines, but stopped going directly 2 years ago and has been going to a chiropractor since then with good result.  Location: Head behind right eye Quality: Sharp Severity: 7/10 on pain scale Duration: 2 days Context: None Course: No improvement or worsening Timing: Constant Modifying factors: No improvement with Tylenol Associated signs and symptoms: Positive for nausea, vomiting and phonophobia. Negative for fever, chills, difficulties with speech or gait, vision changes, mental status changes, photophobia, numbness, weakness. Has had nausea and vomiting this pregnancy, but it is worse with this headache.  Past Medical History  Diagnosis Date  . Migraine   . Kidney stone   . Miscarriage 11/28/2014   OB History  Gravida Para Term Preterm AB SAB TAB Ectopic Multiple Living  # Outcome Date GA Lbr Len/2nd Weight Sex Delivery Anes PTL Lv  2 Current           1 SAB              Past Surgical History  Procedure Laterality Date  . Knee surgery  2008, 2009, 2010   Social History   Social History  . Marital Status: Married    Spouse Name: N/A  . Number of Children: 0  . Years of Education: 12   Occupational History  .     Social History Main Topics  . Smoking status: Never Smoker   . Smokeless tobacco: Never Used  . Alcohol Use: Yes     Comment: rare  . Drug Use: No  . Sexual Activity: Yes   Other Topics Concern  . Not on file   Social History Narrative   Patient lives at home with Husband Loraine Leriche.   Patient has no children    Patient works at the [employer].    Patient  has a Masters         No current facility-administered medications on file prior to encounter.   Current Outpatient Prescriptions on File Prior to Encounter  Medication Sig Dispense Refill  . acetaminophen (TYLENOL) 500 MG tablet Take 1,000 mg by mouth every 6 (six) hours as needed for headache.    . Prenatal Vit-Fe Fumarate-FA (PRENATAL MULTIVITAMIN) TABS tablet Take 1 tablet by mouth daily at 12 noon.    . Doxylamine-Pyridoxine (DICLEGIS) 10-10 MG TBEC Take 2 tablets by mouth at bedtime as needed (nausea).     . metoCLOPramide (REGLAN) 10 MG tablet Take 1 tablet (10 mg total) by mouth 3 (three) times daily as needed for nausea. 10 tablet 0  . promethazine (PHENERGAN) 25 MG tablet Take 1 tablet (25 mg total) by mouth every 6 (six) hours as needed for nausea or vomiting. 30 tablet 2   No Known Allergies  I have reviewed the past Medical Hx, Surgical Hx, Social Hx, Allergies and Medications.   Review of Systems  Constitutional: Negative for fever and chills.  HENT: Positive for congestion (mild). Negative for ear pain, hearing loss, rhinorrhea and sinus pressure.   Eyes: Negative for photophobia, pain and visual disturbance.  Gastrointestinal: Positive for nausea and vomiting. Negative for abdominal  pain.  Musculoskeletal: Negative for neck pain and neck stiffness.  Neurological: Positive for weakness and headaches. Negative for dizziness, seizures, speech difficulty and numbness.  Psychiatric/Behavioral: Negative for confusion.    OBJECTIVE Patient Vitals for the past 24 hrs:  BP Temp Temp src Pulse Resp  08/30/15 1614 119/73 mmHg 97.5 F (36.4 C) Oral 89 18   Constitutional: Well-developed, well-nourished female in mild distress.  Head: Atraumatic. Sinuses nontender. Eyes: Grossly normal vision. Cardiovascular: normal rate Respiratory: normal rate and effort.  GI: Gravid appropriate for gestational age.  MS: Extremities nontender, no edema, normal ROM Neurologic: Alert and  oriented x 4. Normal speech and gait. GU: Deferred   Fetal heart rate 153 by Doppler.  LAB RESULTS Results for orders placed or performed during the hospital encounter of 08/30/15 (from the past 24 hour(s))  Urinalysis, Routine w reflex microscopic (not at [facility])     Status: Abnormal   Collection Time: 08/30/15  4:20 PM  Result Value Ref Range   Color, Urine YELLOW YELLOW   APPearance HAZY (A) CLEAR   Specific Gravity, Urine 1.020 1.005 - 1.030   pH 7.0 5.0 - 8.0   Glucose, UA NEGATIVE NEGATIVE mg/dL   Hgb urine dipstick NEGATIVE NEGATIVE   Bilirubin Urine NEGATIVE NEGATIVE   Ketones, ur >80 (A) NEGATIVE mg/dL   Protein, ur NEGATIVE NEGATIVE mg/dL   Urobilinogen, UA 1.0 0.0 - 1.0 mg/dL   Nitrite NEGATIVE NEGATIVE   Leukocytes, UA NEGATIVE NEGATIVE    IMAGING No results found.  MAU COURSE Phenergan, Fioricet 30 minutes later. Care of pt turned over to Karyl KinnierKim Grayton Lobo, MD at 1800.  Rocky RidgeVirginia Smith, CNM 08/30/2015  6:08 PM   6:48 PM Evaluated patient after administrations of Fioricet and phenergan. She feels "100% better" and is ready for discharge. We discussed migraine prevention with hydration and she can take B6 daily. She also reports neck pain/spasm as a trigger and we discussed use of flexeril. She will also be sent home with Rx for Fioricet for abortive therapy.    Patient voiced understanding for reasons to return to care.   Federico FlakeKimberly Niles Raywood Wailes, MD

## 2015-08-31 ENCOUNTER — Inpatient Hospital Stay (HOSPITAL_COMMUNITY)
Admission: AD | Admit: 2015-08-31 | Payer: BLUE CROSS/BLUE SHIELD | Source: Ambulatory Visit | Admitting: Obstetrics and Gynecology

## 2015-10-03 ENCOUNTER — Encounter (HOSPITAL_COMMUNITY): Payer: Self-pay | Admitting: *Deleted

## 2015-10-23 NOTE — L&D Delivery Note (Signed)
Delivery Note Pt pushed well for about 30 minutes for delivery.  At 7:38 PM a viable and healthy female was delivered via Vaginal, Spontaneous Delivery (Presentation: Left Occiput Anterior).  APGAR: 9, 9; weight  P.   Placenta status: Intact, Spontaneous.  Cord:  3 VC with the following complications: none.  Anesthesia: Epidural  Episiotomy: None Lacerations: partial 3rd degree   Suture Repair: 2.0 vicryl and 3.0 vicryl rapide Est. Blood Loss (mL):  400cc  Mom to postpartum.  Baby to Couplet care / Skin to Skin.  Bovard-Stuckert, Finis Hendricksen 02/11/2016, 8:12 PM  [place] "[employer]  Br/RNI/Contra?[facility]

## 2016-01-19 LAB — OB RESULTS CONSOLE GBS: GBS: NEGATIVE

## 2016-02-06 ENCOUNTER — Telehealth (HOSPITAL_COMMUNITY): Payer: Self-pay | Admitting: *Deleted

## 2016-02-07 ENCOUNTER — Encounter (HOSPITAL_COMMUNITY): Payer: Self-pay | Admitting: *Deleted

## 2016-02-07 NOTE — Telephone Encounter (Signed)
Preadmission screen  

## 2016-02-10 ENCOUNTER — Encounter (HOSPITAL_COMMUNITY): Payer: Self-pay

## 2016-02-10 DIAGNOSIS — Z3493 Encounter for supervision of normal pregnancy, unspecified, third trimester: Secondary | ICD-10-CM

## 2016-02-10 HISTORY — DX: Encounter for supervision of normal pregnancy, unspecified, third trimester: Z34.93

## 2016-02-10 NOTE — H&P (Signed)
Elizabeth Washington is a 30 y.o. female g2P0010 at 39+ for IOL given term and favorable.   +FM, no LOF, no VB, occ ctx.  D/W pt r/b/a of IOL, expect SVD.  Relatively uncomicated prenatal care.  Pt date by First trimester [place].Tdap in [facility]  ]  Maternal Medical History:  Contractions: Frequency: irregular.    Fetal activity: Perceived fetal activity is normal.    Prenatal Complications - Diabetes: none.    OB History    Gravida Para Term Preterm AB TAB SAB Ectopic Multiple Living   2    1  1        G1 SAB G2 present  Pap 12/15 ASCUS, HR HPV No STD  Past Medical History  Diagnosis Date  . Migraine   . Kidney stone   . Miscarriage, threatened, early pregnancy 11/28/2014  . Normal intrauterine pregnancy in third trimester 02/10/2016   Past Surgical History  Procedure Laterality Date  . Knee surgery  2008, 2009, 2010    x3   Family History: family history includes Arthritis in her mother; Heart disease in her father; Hyperlipidemia in her father. Social History:  reports that she has never smoked. She has never used smokeless tobacco. She reports that she drinks alcohol. She reports that she does not use illicit drugs. married Meds PNV, Fiorcet,Zantac All NKDA   Prenatal Transfer Tool  Maternal Diabetes: No Genetic Screening: Normal Maternal Ultrasounds/Referrals: Normal Fetal Ultrasounds or other Referrals:  None Maternal Substance Abuse:  No Significant Maternal Medications:  None Significant Maternal Lab Results:  Lab values include: Group B Strep negative Other Comments:  None  Review of Systems  Constitutional: Negative.   HENT: Negative.   Eyes: Negative.   Respiratory: Negative.   Cardiovascular: Negative.   Gastrointestinal: Negative.   Genitourinary: Negative.   Musculoskeletal: Negative.   Skin: Negative.   Neurological: Negative.   Psychiatric/Behavioral: Negative.       Last menstrual period 05/01/2015, unknown if currently breastfeeding. Maternal Exam:   Uterine Assessment: Contraction frequency is irregular.   Abdomen: Patient reports no abdominal tenderness. Fundal height is appropriate for gestation.   Estimated fetal weight is 7.5-8.5#.    Introitus: Normal vulva. Normal vagina.  Pelvis: adequate for delivery.      Physical Exam  Constitutional: She is oriented to person, place, and time. She appears well-developed and well-nourished.  HENT:  Head: Normocephalic and atraumatic.  Cardiovascular: Normal rate and regular rhythm.   Respiratory: Effort normal and breath sounds normal. No respiratory distress. She has no wheezes.  GI: Soft. Bowel sounds are normal. She exhibits no distension. There is no tenderness.  Musculoskeletal: Normal range of motion.  Neurological: She is alert and oriented to person, place, and time.  Skin: Skin is warm and dry.  Psychiatric: She has a normal mood and affect. Her behavior is normal.    Prenatal labs: ABO, Rh: O/Positive/-- (09/19 0000) Antibody: Negative (09/19 0000) Rubella: Nonimmune (09/19 0000) RPR: Nonreactive (09/19 0000)  HBsAg: Negative (09/19 0000)  HIV: Non-reactive (09/19 0000)  GBS: Negative (03/30 0000)   Hgb 14.2/Plt 250/Ur Cx neg/GC neg/ Chl neg/glucola 111/  Dated by first tri [place] Normal first trimester screen [place] - nl anat, post plac, female  Tdap 11/11/15 Flu 09/20/15  Assessment/Plan: 29yo G2P0010 at 39+ for IOL given term and favorable cervix gbbs neg Expect SVD AROM and pitocin for IOL Rubella NI - PP MMR Bovard-Stuckert, Ayn Domangue 02/10/2016, 10:50 PM

## 2016-02-11 ENCOUNTER — Inpatient Hospital Stay (HOSPITAL_COMMUNITY): Payer: Managed Care, Other (non HMO) | Admitting: Anesthesiology

## 2016-02-11 ENCOUNTER — Encounter (HOSPITAL_COMMUNITY): Payer: Self-pay

## 2016-02-11 ENCOUNTER — Inpatient Hospital Stay (HOSPITAL_COMMUNITY): Admission: RE | Admit: 2016-02-11 | Payer: Managed Care, Other (non HMO) | Source: Ambulatory Visit

## 2016-02-11 ENCOUNTER — Inpatient Hospital Stay (HOSPITAL_COMMUNITY)
Admission: RE | Admit: 2016-02-11 | Discharge: 2016-02-13 | DRG: 775 | Disposition: A | Discharge status: Home / Self Care | Payer: Managed Care, Other (non HMO) | Source: Ambulatory Visit | Attending: Obstetrics and Gynecology | Admitting: Obstetrics and Gynecology

## 2016-02-11 VITALS — BP 113/63 | HR 83 | Temp 98.2°F | Resp 16 | Ht 68.0 in | Wt 201.0 lb

## 2016-02-11 DIAGNOSIS — Z87442 Personal history of urinary calculi: Secondary | ICD-10-CM

## 2016-02-11 DIAGNOSIS — Z3493 Encounter for supervision of normal pregnancy, unspecified, third trimester: Secondary | ICD-10-CM

## 2016-02-11 DIAGNOSIS — Z3A39 39 weeks gestation of pregnancy: Secondary | ICD-10-CM | POA: Diagnosis not present

## 2016-02-11 DIAGNOSIS — Z8249 Family history of ischemic heart disease and other diseases of the circulatory system: Secondary | ICD-10-CM

## 2016-02-11 DIAGNOSIS — Z8261 Family history of arthritis: Secondary | ICD-10-CM | POA: Diagnosis not present

## 2016-02-11 DIAGNOSIS — Z3480 Encounter for supervision of other normal pregnancy, unspecified trimester: Secondary | ICD-10-CM

## 2016-02-11 HISTORY — DX: Encounter for supervision of normal pregnancy, unspecified, third trimester: Z34.93

## 2016-02-11 LAB — TYPE AND SCREEN
ABO/RH(D): O POS
Antibody Screen: NEGATIVE

## 2016-02-11 LAB — CBC
HCT: 36.7 % (ref 36.0–46.0)
Hemoglobin: 12.6 g/dL (ref 12.0–15.0)
MCH: 30.4 pg (ref 26.0–34.0)
MCHC: 34.3 g/dL (ref 30.0–36.0)
MCV: 88.4 fL (ref 78.0–100.0)
Platelets: 252 K/uL (ref 150–400)
RBC: 4.15 MIL/uL (ref 3.87–5.11)
RDW: 13.7 % (ref 11.5–15.5)
WBC: 8.4 K/uL (ref 4.0–10.5)

## 2016-02-11 LAB — ABO/RH: ABO/RH(D): O POS

## 2016-02-11 LAB — SYPHILIS: RPR W/REFLEX TO RPR TITER AND TREPONEMAL ANTIBODIES, TRADITIONAL SCREENING AND DIAGNOSIS ALGORITHM: RPR Ser Ql: NONREACTIVE

## 2016-02-11 MED ORDER — OXYTOCIN 10 UNIT/ML IJ SOLN: 2.5000 [IU]/h | INTRAMUSCULAR | Status: DC

## 2016-02-11 MED ORDER — LACTATED RINGERS IV SOLN: INTRAVENOUS | Status: DC

## 2016-02-11 MED ORDER — BUPIVACAINE HCL (PF) 0.25 % IJ SOLN
INTRAMUSCULAR | Status: DC | PRN
  Administered 2016-02-11 (×2): 4 mL via EPIDURAL

## 2016-02-11 MED ORDER — LACTATED RINGERS IV SOLN
500.0000 mL | INTRAVENOUS | Status: DC | PRN
  Administered 2016-02-11: 500 mL via INTRAVENOUS

## 2016-02-11 MED ORDER — ONDANSETRON HCL 4 MG/2ML IJ SOLN
4.0000 mg | Freq: Four times a day (QID) | INTRAMUSCULAR | Status: DC | PRN
  Administered 2016-02-11: 4 mg via INTRAVENOUS
  Filled 2016-02-11: qty 2

## 2016-02-11 MED ORDER — PHENYLEPHRINE 40 MCG/ML (10ML) SYRINGE FOR IV PUSH (FOR BLOOD PRESSURE SUPPORT)
80.0000 ug | PREFILLED_SYRINGE | INTRAVENOUS | Status: DC | PRN
  Filled 2016-02-11: qty 20
  Filled 2016-02-11: qty 2
  Filled 2016-02-11: qty 20

## 2016-02-11 MED ORDER — IBUPROFEN 600 MG PO TABS
600.0000 mg | ORAL_TABLET | Freq: Four times a day (QID) | ORAL | Status: DC
  Administered 2016-02-11 – 2016-02-13 (×7): 600 mg via ORAL
  Filled 2016-02-11 (×6): qty 1

## 2016-02-11 MED ORDER — ACETAMINOPHEN 325 MG PO TABS
650.0000 mg | ORAL_TABLET | ORAL | Status: DC | PRN
  Administered 2016-02-11: 650 mg via ORAL
  Filled 2016-02-11: qty 2

## 2016-02-11 MED ORDER — OXYCODONE-ACETAMINOPHEN 5-325 MG PO TABS: 2.0000 | ORAL_TABLET | ORAL | Status: DC | PRN

## 2016-02-11 MED ORDER — COCONUT OIL OIL: 1.0000 "application " | TOPICAL_OIL | Status: DC | PRN

## 2016-02-11 MED ORDER — DIBUCAINE 1 % RE OINT
1.0000 "application " | TOPICAL_OINTMENT | RECTAL | Status: DC | PRN
  Administered 2016-02-11: 1 via RECTAL
  Filled 2016-02-11: qty 28

## 2016-02-11 MED ORDER — SENNOSIDES-DOCUSATE SODIUM 8.6-50 MG PO TABS
2.0000 | ORAL_TABLET | ORAL | Status: DC
  Administered 2016-02-11 – 2016-02-12 (×2): 2 via ORAL
  Filled 2016-02-11 (×2): qty 2

## 2016-02-11 MED ORDER — PHENYLEPHRINE 40 MCG/ML (10ML) SYRINGE FOR IV PUSH (FOR BLOOD PRESSURE SUPPORT)
80.0000 ug | PREFILLED_SYRINGE | INTRAVENOUS | Status: DC | PRN
  Filled 2016-02-11: qty 2

## 2016-02-11 MED ORDER — TERBUTALINE SULFATE 1 MG/ML IJ SOLN: 0.2500 mg | Freq: Once | INTRAMUSCULAR | Status: DC | PRN

## 2016-02-11 MED ORDER — PRENATAL MULTIVITAMIN CH
1.0000 | ORAL_TABLET | Freq: Every day | ORAL | Status: DC
  Administered 2016-02-12 – 2016-02-13 (×2): 1 via ORAL
  Filled 2016-02-11: qty 1

## 2016-02-11 MED ORDER — EPHEDRINE 5 MG/ML INJ
10.0000 mg | INTRAVENOUS | Status: DC | PRN
  Filled 2016-02-11: qty 2

## 2016-02-11 MED ORDER — ACETAMINOPHEN 325 MG PO TABS: 650.0000 mg | ORAL_TABLET | ORAL | Status: DC | PRN

## 2016-02-11 MED ORDER — OXYTOCIN BOLUS FROM INFUSION
500.0000 mL | INTRAVENOUS | Status: DC
  Administered 2016-02-11: 500 mL via INTRAVENOUS

## 2016-02-11 MED ORDER — OXYCODONE-ACETAMINOPHEN 5-325 MG PO TABS: 1.0000 | ORAL_TABLET | ORAL | Status: DC | PRN

## 2016-02-11 MED ORDER — ONDANSETRON HCL 4 MG/2ML IJ SOLN: 4.0000 mg | INTRAMUSCULAR | Status: DC | PRN

## 2016-02-11 MED ORDER — DIPHENHYDRAMINE HCL 25 MG PO CAPS: 25.0000 mg | ORAL_CAPSULE | Freq: Four times a day (QID) | ORAL | Status: DC | PRN

## 2016-02-11 MED ORDER — WITCH HAZEL-GLYCERIN EX PADS
1.0000 "application " | MEDICATED_PAD | CUTANEOUS | Status: DC | PRN
  Administered 2016-02-11: 1 via TOPICAL

## 2016-02-11 MED ORDER — ONDANSETRON HCL 4 MG PO TABS: 4.0000 mg | ORAL_TABLET | ORAL | Status: DC | PRN

## 2016-02-11 MED ORDER — CITRIC ACID-SODIUM CITRATE 334-500 MG/5ML PO SOLN: 30.0000 mL | ORAL | Status: DC | PRN

## 2016-02-11 MED ORDER — FENTANYL 2.5 MCG/ML BUPIVACAINE 1/10 % EPIDURAL INFUSION (WH - ANES)
14.0000 mL/h | INTRAMUSCULAR | Status: DC | PRN
  Administered 2016-02-11 (×2): 14 mL/h via EPIDURAL
  Filled 2016-02-11 (×2): qty 125

## 2016-02-11 MED ORDER — ZOLPIDEM TARTRATE 5 MG PO TABS: 5.0000 mg | ORAL_TABLET | Freq: Every evening | ORAL | Status: DC | PRN

## 2016-02-11 MED ORDER — LACTATED RINGERS IV SOLN
INTRAVENOUS | Status: DC
  Administered 2016-02-11 (×3): via INTRAVENOUS

## 2016-02-11 MED ORDER — OXYTOCIN 10 UNIT/ML IJ SOLN
1.0000 m[IU]/min | INTRAVENOUS | Status: DC
  Administered 2016-02-11: 2 m[IU]/min via INTRAVENOUS
  Filled 2016-02-11: qty 4

## 2016-02-11 MED ORDER — BENZOCAINE-MENTHOL 20-0.5 % EX AERO
1.0000 "application " | INHALATION_SPRAY | CUTANEOUS | Status: DC | PRN
  Administered 2016-02-11: 1 via TOPICAL
  Filled 2016-02-11: qty 56

## 2016-02-11 MED ORDER — DIPHENHYDRAMINE HCL 50 MG/ML IJ SOLN: 12.5000 mg | INTRAMUSCULAR | Status: DC | PRN

## 2016-02-11 MED ORDER — LIDOCAINE HCL (PF) 1 % IJ SOLN
INTRAMUSCULAR | Status: DC | PRN
  Administered 2016-02-11 (×2): 4 mL

## 2016-02-11 MED ORDER — FAMOTIDINE 20 MG PO TABS
20.0000 mg | ORAL_TABLET | Freq: Every day | ORAL | Status: DC
  Administered 2016-02-11 – 2016-02-12 (×2): 20 mg via ORAL
  Filled 2016-02-11 (×2): qty 1

## 2016-02-11 MED ORDER — LACTATED RINGERS IV SOLN
500.0000 mL | Freq: Once | INTRAVENOUS | Status: AC
  Administered 2016-02-11: 500 mL via INTRAVENOUS

## 2016-02-11 MED ORDER — MEASLES, MUMPS & RUBELLA VAC ~~LOC~~ INJ
0.5000 mL | INJECTION | Freq: Once | SUBCUTANEOUS | Status: AC
  Administered 2016-02-13: 0.5 mL via SUBCUTANEOUS
  Filled 2016-02-11 (×2): qty 0.5

## 2016-02-11 MED ORDER — BUTORPHANOL TARTRATE 1 MG/ML IJ SOLN: 1.0000 mg | INTRAMUSCULAR | Status: DC | PRN

## 2016-02-11 MED ORDER — LIDOCAINE HCL (PF) 1 % IJ SOLN
30.0000 mL | INTRAMUSCULAR | Status: DC | PRN
  Filled 2016-02-11: qty 30

## 2016-02-11 MED ORDER — SIMETHICONE 80 MG PO CHEW: 80.0000 mg | CHEWABLE_TABLET | ORAL | Status: DC | PRN

## 2016-02-11 NOTE — Anesthesia Preprocedure Evaluation (Signed)
Anesthesia Evaluation  Patient identified by MRN, date of birth, ID band Patient awake    Reviewed: Allergy & Precautions, NPO status , Patient's Chart, lab work & pertinent test results  History of Anesthesia Complications Negative for: history of anesthetic complications  Airway Mallampati: II  TM Distance: >3 FB Neck ROM: Full    Dental no notable dental hx. (+) Dental Advisory Given   Pulmonary neg pulmonary ROS,    Pulmonary exam normal breath sounds clear to auscultation       Cardiovascular negative cardio ROS Normal cardiovascular exam Rhythm:Regular Rate:Normal     Neuro/Psych  Headaches, negative psych ROS   GI/Hepatic negative GI ROS, Neg liver ROS,   Endo/Other  negative endocrine ROS  Renal/GU negative Renal ROS  negative genitourinary   Musculoskeletal negative musculoskeletal ROS (+)   Abdominal   Peds negative pediatric ROS (+)  Hematology negative hematology ROS (+)   Anesthesia Other Findings   Reproductive/Obstetrics (+) Pregnancy                             Anesthesia Physical Anesthesia Plan  ASA: II  Anesthesia Plan: Epidural   Post-op Pain Management:    Induction:   Airway Management Planned:   Additional Equipment:   Intra-op Plan:   Post-operative Plan:   Informed Consent: I have reviewed the patients History and Physical, chart, labs and discussed the procedure including the risks, benefits and alternatives for the proposed anesthesia with the patient or authorized representative who has indicated his/her understanding and acceptance.   Dental advisory given  Plan Discussed with: CRNA  Anesthesia Plan Comments:         Anesthesia Quick Evaluation  

## 2016-02-11 NOTE — Anesthesia Procedure Notes (Signed)
Epidural Patient location during procedure: OB  Staffing Anesthesiologist: Elson Ulbrich Performed by: anesthesiologist   Preanesthetic Checklist Completed: patient identified, site marked, surgical consent, pre-op evaluation, timeout performed, IV checked, risks and benefits discussed and monitors and equipment checked  Epidural Patient position: sitting Prep: site prepped and draped and DuraPrep Patient monitoring: continuous pulse ox and blood pressure Approach: midline Location: L3-L4 Injection technique: LOR saline  Needle:  Needle type: Tuohy  Needle gauge: 17 G Needle length: 9 cm and 9 Needle insertion depth: 6 cm Catheter type: closed end flexible Catheter size: 19 Gauge Catheter at skin depth: 10 cm Test dose: negative  Assessment Events: blood not aspirated, injection not painful, no injection resistance, negative IV test and paresthesia (L sided paresethesia when threading catheter, resolved immediately, not present with final catheter position or prior to injection)  Additional Notes Patient identified. Risks/Benefits/Options discussed with patient including but not limited to bleeding, infection, nerve damage, paralysis, failed block, incomplete pain control, headache, blood pressure changes, nausea, vomiting, reactions to medication both or allergic, itching and postpartum back pain. Confirmed with bedside nurse the patient's most recent platelet count. Confirmed with patient that they are not currently taking any anticoagulation, have any bleeding history or any family history of bleeding disorders. Patient expressed understanding and wished to proceed. All questions were answered. Sterile technique was used throughout the entire procedure. Please see nursing notes for vital signs. Test dose was given through epidural catheter and negative prior to continuing to dose epidural or start infusion. Warning signs of high block given to the patient including shortness of  breath, tingling/numbness in hands, complete motor block, or any concerning symptoms with instructions to call for help. Patient was given instructions on fall risk and not to get out of bed. All questions and concerns addressed with instructions to call with any issues or inadequate analgesia.

## 2016-02-11 NOTE — Progress Notes (Signed)
Patient ID: Sydnee LevansKristen M Chartrand, female   DOB: July 05, 1986, 30 y.o.   MRN: [id number]  H&P reviewed, no c/o's  AF VSS gen NAD  FHTs 150's, good var, category 1 toco rare ctx  SVE 2.3 /70/-2 AROM for clear fluid, w/o diff/comp  Continue IOL

## 2016-02-12 ENCOUNTER — Encounter (HOSPITAL_COMMUNITY): Payer: Self-pay

## 2016-02-12 LAB — CBC
HCT: 27.9 % — ABNORMAL LOW (ref 36.0–46.0)
Hemoglobin: 9.6 g/dL — ABNORMAL LOW (ref 12.0–15.0)
MCH: 30.6 pg (ref 26.0–34.0)
MCHC: 34.4 g/dL (ref 30.0–36.0)
MCV: 88.9 fL (ref 78.0–100.0)
PLATELETS: 229 10*3/uL (ref 150–400)
RBC: 3.14 MIL/uL — ABNORMAL LOW (ref 3.87–5.11)
RDW: 13.7 % (ref 11.5–15.5)
WBC: 12.4 10*3/uL — ABNORMAL HIGH (ref 4.0–10.5)

## 2016-02-12 MED ORDER — HYDROCORTISONE ACE-PRAMOXINE 1-1 % RE FOAM
1.0000 | Freq: Two times a day (BID) | RECTAL | Status: DC
  Administered 2016-02-12 – 2016-02-13 (×2): 1 via RECTAL
  Filled 2016-02-12 (×2): qty 10

## 2016-02-12 NOTE — Progress Notes (Signed)
Post Partum Day 1  Subjective: no complaints, up ad lib, voiding, tolerating PO and nl lochia, pain controlled  Objective: Blood pressure 106/64, pulse 79, temperature 98.2 F (36.8 C), temperature source Oral, resp. rate 20, height 5\' 8"  (1.727 m), weight 91.173 kg (201 lb), last menstrual period 05/01/2015, SpO2 100 %, unknown if currently breastfeeding.  Physical Exam:  General: alert and no distress Lochia: appropriate Uterine Fundus: firm    Recent Labs  02/11/16 0815 02/12/16 0516  HGB 12.6 9.6*  HCT 36.7 27.9*    Assessment/Plan: Plan for discharge tomorrow, Breastfeeding and Lactation consult   Routine care.    LOS: 1 day   Bovard-Stuckert, Lanae Federer 02/12/2016, 8:30 AM

## 2016-02-12 NOTE — Anesthesia Postprocedure Evaluation (Signed)
Anesthesia Post Note  Patient: Elizabeth Washington  Procedure(s) Performed: * No procedures listed *  Patient location during evaluation: Mother Baby Anesthesia Type: Epidural Level of consciousness: awake and alert Pain management: satisfactory to patient Vital Signs Assessment: post-procedure vital signs reviewed and stable Respiratory status: respiratory function stable Cardiovascular status: stable Postop Assessment: no headache, no backache, epidural receding, patient able to bend at knees, no signs of nausea or vomiting and adequate PO intake Anesthetic complications: no Comments: Comfort level was assessed by AnesthesiaTeam and the patient was pleased with the care, interventions, and services provided by the Department of Anesthesia.    Last Vitals:  Filed Vitals:   02/11/16 2345 02/12/16 0345  BP: 121/75 106/64  Pulse: 87 79  Temp: 36.8 C 36.8 C  Resp: 18 20    Last Pain:  Filed Vitals:   02/12/16 0633  PainSc: 5                  Kelsy Polack

## 2016-02-12 NOTE — Lactation Note (Signed)
This note was copied from a baby's chart. Lactation Consultation Note Initial visit made.  Breastfeeding consultation services and support information given and reviewed with patient.  Baby is 3018 hours old and she has been to breast twice.  Mom concerned because she is sleepy and showing little interest in feeding.  Discussed normal first 24 hours when baby is often sleepy and cluster feeding on day 2-3.  Reviewed skin to skin and waking techniques.  Encouraged to feed with any feeding cue and to call for assist prn.  Patient Name: Elizabeth Washington ZOXWR'UToday's Date: 02/12/2016 Reason for consult: Initial assessment   Maternal Data    Feeding Feeding Type: Breast Fed Length of feed: 3 min  LATCH Score/Interventions Latch: Repeated attempts needed to sustain latch, nipple held in mouth throughout feeding, stimulation needed to elicit sucking reflex. (sleepy at breast) Intervention(s): Adjust position;Assist with latch  Audible Swallowing: None Intervention(s): Skin to skin  Type of Nipple: Everted at rest and after stimulation  Comfort (Breast/Nipple): Soft / non-tender     Hold (Positioning): Assistance needed to correctly position infant at breast and maintain latch.  LATCH Score: 6  Lactation Tools Discussed/Used     Consult Status Consult Status: Follow-up Date: 02/13/16 Follow-up type: In-patient    Huston FoleyMOULDEN, Maiko Salais S 02/12/2016, 2:06 PM

## 2016-02-13 MED ORDER — OXYCODONE-ACETAMINOPHEN 5-325 MG PO TABS: 1.0000 | ORAL_TABLET | Freq: Four times a day (QID) | ORAL | Status: DC | PRN

## 2016-02-13 MED ORDER — PRENATAL MULTIVITAMIN CH: 1.0000 | ORAL_TABLET | Freq: Every day | ORAL | Status: DC

## 2016-02-13 MED ORDER — IBUPROFEN 800 MG PO TABS: 800.0000 mg | ORAL_TABLET | Freq: Three times a day (TID) | ORAL | Status: DC | PRN

## 2016-02-13 MED ORDER — HYDROCORTISONE ACE-PRAMOXINE 2.5-1 % RE CREA: TOPICAL_CREAM | Freq: Three times a day (TID) | RECTAL | Status: DC | PRN

## 2016-02-13 NOTE — Lactation Note (Signed)
This note was copied from a baby's chart. Lactation Consultation Note: Mother latched infant on and states that she feels pain scale of 6-7.  Assist mother with  re-latching using nipple to nose technique. Father taught to massage infant jaw and roll top lip and lower lip.  Infants lips are wider with depth. Mother states latch feels much better.  Mother has bilateral pink nipples. Mother advised to hand express and to continue to use the comfort gels.  Mother taught to roll nipples before latching infant on the breast. Mother advised in treatment to prevent severe engorgement. Mother informed of available lactation services and encouraged to follow up in one week.   Patient Name: Elizabeth Washington [id number]'ZToday's Date: 02/13/2016 Reason for consult: Follow-up assessment   Maternal Data    Feeding Feeding Type: Breast Fed Length of feed: 20 min  LATCH Score/Interventions Latch: Repeated attempts needed to sustain latch, nipple held in mouth throughout feeding, stimulation needed to elicit sucking reflex. Intervention(s): Adjust position;Assist with latch;Breast compression  Audible Swallowing: A few with stimulation Intervention(s): Hand expression Intervention(s): Hand expression  Type of Nipple: Everted at rest and after stimulation  Comfort (Breast/Nipple): Filling, red/small blisters or bruises, mild/mod discomfort  Problem noted: Mild/Moderate discomfort Interventions (Mild/moderate discomfort): Comfort gels;Hand expression  Hold (Positioning): No assistance needed to correctly position infant at breast. Intervention(s): Support Pillows;Position options;Breastfeeding basics reviewed  LATCH Score: 7  Lactation Tools Discussed/Used     Consult Status Consult Status: Complete    Elizabeth Washington, Elizabeth Washington 02/13/2016, 9:20 AM

## 2016-02-13 NOTE — Progress Notes (Addendum)
Post Partum Day 2 Subjective: no complaints, up ad lib, voiding, tolerating PO and nl lochia, pain controlled  Objective: Blood pressure 113/63, pulse 83, temperature 98.2 F (36.8 C), temperature source Oral, resp. rate 16, height 5\' 8"  (1.727 m), weight 91.173 kg (201 lb), last menstrual period 05/01/2015, SpO2 99 %, unknown if currently breastfeeding.  Physical Exam:  General: alert and no distress Lochia: appropriate Uterine Fundus: firm   Recent Labs  02/11/16 0815 02/12/16 0516  HGB 12.6 9.6*  HCT 36.7 27.9*    Assessment/Plan: Discharge home, Breastfeeding and Lactation consult.  Routine care.  D/c with motrin and PNV.  F/u 6 weeks   LOS: 2 days   Bovard-Stuckert, Laiyla Slagel 02/13/2016, 8:00 AM   Pt requests Percocet at discharge.

## 2016-02-13 NOTE — Discharge Summary (Signed)
OB Discharge Summary     Patient Name: Elizabeth Washington DOB: [birth year] MRN: [id number]  Date of admission: 02/11/2016 Delivering MD: Sherian Rein   Date of discharge: 02/13/2016  Admitting diagnosis: 40WKS INDUCTION  Intrauterine pregnancy: [redacted]w[redacted]d     Secondary diagnosis:  Principal Problem:   SVD (spontaneous vaginal delivery) Active Problems:   Normal intrauterine pregnancy in third trimester   Other normal pregnancy, not first  Additional problems: N/A     Discharge diagnosis: Term Pregnancy Delivered                                                                                                Post partum procedures:N/A  Augmentation: AROM and Pitocin  Complications: None  Hospital course:  Induction of Labor With Vaginal Delivery   30 y.o. yo G2P1011 at [redacted]w[redacted]d was admitted to the hospital 02/11/2016 for induction of labor.  Indication for induction: Favorable cervix at term.  Patient had an uncomplicated labor course as follows: Membrane Rupture Time/Date: 8:45 AM ,02/11/2016   Intrapartum Procedures: Episiotomy: None [1]                                         Lacerations:  3rd degree [4]  Patient had delivery of a Viable infant.  Information for the patient's newborn:  Gloriann, Riede [[id number]]  Delivery Method: Vaginal, Spontaneous Delivery (Filed from Delivery Summary)   02/11/2016  Details of delivery can be found in separate delivery note.  Patient had a routine postpartum course. Patient is discharged home 02/13/2016.   Physical exam  Filed Vitals:   02/12/16 1307 02/12/16 1857 02/13/16 0548 02/13/16 0754  BP: 111/65 115/64 121/73 113/63  Pulse: 74 85 88 83  Temp: 99.2 F (37.3 C) 98.7 F (37.1 C) 97.8 F (36.6 C) 98.2 F (36.8 C)  TempSrc: Oral Oral Oral Oral  Resp: Height:      Weight:      SpO2:    99%   General: alert and no distress Lochia: appropriate Uterine Fundus: firm  Labs: Lab Results  Component Value  Date   WBC 12.4* 02/12/2016   HGB 9.6* 02/12/2016   HCT 27.9* 02/12/2016   MCV 88.9 02/12/2016   PLT 229 02/12/2016   CMP Latest Ref Rng 07/08/2015  Glucose 65 - 99 mg/dL 83  BUN 6 - 20 mg/dL 12  Creatinine 7.82 - 9.56 mg/dL 2.13  Sodium 086 - 578 mmol/L 135  Potassium 3.5 - 5.1 mmol/L 4.3  Chloride 101 - 111 mmol/L 105  CO2 22 - 32 mmol/L 23  Calcium 8.9 - 10.3 mg/dL 4.6(N)  Total Protein 6.5 - 8.1 g/dL 7.0  Total Bilirubin 0.3 - 1.2 mg/dL 0.5  Alkaline Phos 38 - 126 U/L 39  AST 15 - 41 U/L 17  ALT 14 - 54 U/L 18    Discharge instruction: per After Visit Summary and "Baby and Me Booklet".  After visit meds:    Medication List  TAKE these medications        hydrocortisone-pramoxine 2.5-1 % rectal cream  Commonly known as:  ANALPRAM-HC  Place rectally 3 (three) times daily as needed for hemorrhoids or itching.     ibuprofen 800 MG tablet  Commonly known as:  ADVIL,MOTRIN  Take 1 tablet (800 mg total) by mouth every 8 (eight) hours as needed.     oxyCODONE-acetaminophen 5-325 MG tablet  Commonly known as:  PERCOCET/ROXICET  Take 1-2 tablets by mouth every 6 (six) hours as needed for severe pain (pain scale 4-7).     prenatal multivitamin Tabs tablet  Take 1 tablet by mouth daily at 12 noon.        Diet: routine diet  Activity: Advance as tolerated. Pelvic rest for 6 weeks.   Outpatient follow up:6 weeks Follow up Appt:No future appointments. Follow up Visit:No Follow-up on file.  Postpartum contraception: Undecided  Newborn Data: Live born female  Birth Weight: 7 lb 2.1 oz (3235 g) APGAR: 9, 9  Baby Feeding: Breast Disposition:home with mother   02/13/2016 Sherian ReinBovard-Stuckert, Jensyn Cambria, MD

## 2016-10-22 NOTE — L&D Delivery Note (Signed)
Delivery Note Pt pushed very well with a handful of contractions for delivery.  At 2:08 PM a viable and healthy female was delivered via Vaginal, Spontaneous Delivery (Presentation: OA; ROA ).  APGAR: 9, 9; weight P .   Placenta status: delivered, intact.  Cord: 3V  with the following complications: none.   Anesthesia:  epidural Episiotomy: None Lacerations: 2nd degree and R sided Suture Repair: 3.0 vicryl rapide Est. Blood Loss (mL): 200   Mom to postpartum.  Baby to Couplet care / Skin to Skin.  Elizabeth Washington 07/14/2017, 2:41 PM  Br by pumping/O+/RI/Tdap in PNC/ Contra ?

## 2016-12-14 LAB — OB RESULTS CONSOLE GC/CHLAMYDIA
Chlamydia: NEGATIVE
Gonorrhea: NEGATIVE

## 2016-12-14 LAB — OB RESULTS CONSOLE ANTIBODY SCREEN: ANTIBODY SCREEN: NEGATIVE

## 2016-12-14 LAB — OB RESULTS CONSOLE HEPATITIS B SURFACE ANTIGEN: HEP B S AG: NEGATIVE

## 2016-12-14 LAB — OB RESULTS CONSOLE HIV ANTIBODY (ROUTINE TESTING): HIV: NONREACTIVE

## 2016-12-14 LAB — OB RESULTS CONSOLE RUBELLA ANTIBODY, IGM: Rubella: IMMUNE

## 2016-12-14 LAB — OB RESULTS CONSOLE RPR: RPR: NONREACTIVE

## 2016-12-14 LAB — OB RESULTS CONSOLE ABO/RH: RH TYPE: POSITIVE

## 2017-01-11 ENCOUNTER — Ambulatory Visit (INDEPENDENT_AMBULATORY_CARE_PROVIDER_SITE_OTHER): Payer: Managed Care, Other (non HMO)

## 2017-01-11 DIAGNOSIS — Z111 Encounter for screening for respiratory tuberculosis: Secondary | ICD-10-CM | POA: Diagnosis not present

## 2017-01-11 MED ORDER — TUBERCULIN PPD 5 UNIT/0.1ML ID SOLN
5.0000 [IU] | Freq: Once | INTRADERMAL | Status: AC
  Administered 2017-01-11: 5 [IU] via INTRADERMAL

## 2017-01-11 NOTE — Progress Notes (Signed)
Patient here for PPD skin test, test administered ID to rt forearm; patient tolerated well; no s/s of reaction; patient to return within 48-72 hours for test to be read.

## 2017-01-14 ENCOUNTER — Other Ambulatory Visit: Payer: Self-pay

## 2017-01-14 NOTE — Progress Notes (Signed)
PPD Reading Note 01/14/2017 PPD read and results entered in EpicCare. Result: 0 mm induration. Interpretation: Negative If test not read within 48-72 hours of initial placement, patient advised to repeat in other arm 1-3 weeks after this test. Allergic reaction: No

## 2017-01-27 ENCOUNTER — Inpatient Hospital Stay (HOSPITAL_COMMUNITY)
Admission: AD | Admit: 2017-01-27 | Discharge: 2017-01-27 | Disposition: A | Discharge status: Home / Self Care | Payer: Managed Care, Other (non HMO) | Source: Ambulatory Visit | Attending: Obstetrics and Gynecology | Admitting: Obstetrics and Gynecology

## 2017-01-27 ENCOUNTER — Encounter (HOSPITAL_COMMUNITY): Payer: Self-pay | Admitting: *Deleted

## 2017-01-27 DIAGNOSIS — O99352 Diseases of the nervous system complicating pregnancy, second trimester: Secondary | ICD-10-CM | POA: Insufficient documentation

## 2017-01-27 DIAGNOSIS — O9989 Other specified diseases and conditions complicating pregnancy, childbirth and the puerperium: Secondary | ICD-10-CM | POA: Insufficient documentation

## 2017-01-27 DIAGNOSIS — Z3A15 15 weeks gestation of pregnancy: Secondary | ICD-10-CM | POA: Insufficient documentation

## 2017-01-27 DIAGNOSIS — Z8249 Family history of ischemic heart disease and other diseases of the circulatory system: Secondary | ICD-10-CM | POA: Insufficient documentation

## 2017-01-27 DIAGNOSIS — Z8042 Family history of malignant neoplasm of prostate: Secondary | ICD-10-CM | POA: Insufficient documentation

## 2017-01-27 DIAGNOSIS — Z8261 Family history of arthritis: Secondary | ICD-10-CM | POA: Diagnosis not present

## 2017-01-27 DIAGNOSIS — Z87442 Personal history of urinary calculi: Secondary | ICD-10-CM | POA: Insufficient documentation

## 2017-01-27 DIAGNOSIS — O219 Vomiting of pregnancy, unspecified: Secondary | ICD-10-CM | POA: Insufficient documentation

## 2017-01-27 DIAGNOSIS — O09292 Supervision of pregnancy with other poor reproductive or obstetric history, second trimester: Secondary | ICD-10-CM | POA: Insufficient documentation

## 2017-01-27 DIAGNOSIS — Z9889 Other specified postprocedural states: Secondary | ICD-10-CM | POA: Diagnosis not present

## 2017-01-27 DIAGNOSIS — R51 Headache: Secondary | ICD-10-CM | POA: Diagnosis not present

## 2017-01-27 DIAGNOSIS — R519 Headache, unspecified: Secondary | ICD-10-CM

## 2017-01-27 LAB — URINALYSIS, ROUTINE W REFLEX MICROSCOPIC
BILIRUBIN URINE: NEGATIVE
Glucose, UA: NEGATIVE mg/dL
Hgb urine dipstick: NEGATIVE
KETONES UR: 80 mg/dL — AB
Leukocytes, UA: NEGATIVE
NITRITE: NEGATIVE
PROTEIN: NEGATIVE mg/dL
Specific Gravity, Urine: 1.02 (ref 1.005–1.030)
pH: 5 (ref 5.0–8.0)

## 2017-01-27 MED ORDER — LACTATED RINGERS IV SOLN
INTRAVENOUS | Status: DC
  Administered 2017-01-27: 17:00:00 via INTRAVENOUS

## 2017-01-27 MED ORDER — DIPHENHYDRAMINE HCL 50 MG/ML IJ SOLN
25.0000 mg | Freq: Once | INTRAMUSCULAR | Status: AC
  Administered 2017-01-27: 25 mg via INTRAVENOUS
  Filled 2017-01-27: qty 1

## 2017-01-27 MED ORDER — DEXAMETHASONE SODIUM PHOSPHATE 10 MG/ML IJ SOLN
10.0000 mg | Freq: Once | INTRAMUSCULAR | Status: AC
Start: 2017-01-27 — End: 2017-01-27
  Administered 2017-01-27: 10 mg via INTRAVENOUS
  Filled 2017-01-27: qty 1

## 2017-01-27 MED ORDER — METOCLOPRAMIDE HCL 5 MG/ML IJ SOLN
10.0000 mg | Freq: Once | INTRAMUSCULAR | Status: AC
Start: 2017-01-27 — End: 2017-01-27
  Administered 2017-01-27: 10 mg via INTRAVENOUS
  Filled 2017-01-27: qty 2

## 2017-01-27 NOTE — MAU Provider Note (Signed)
Chief Complaint:  Emesis During Pregnancy and Headache   First Provider Initiated Contact with Patient 01/27/17 1621     HPI: Elizabeth Washington is a 31 y.o. G3P1011 at 28w1dwho presents to maternity admissions reporting vomiting since yesterday, but primary complaint is headache.  Started with Diarrhea this morning, but only had 2 loose stools.  Also has a headache. States headache is like usual migraines but did not get better with Percocet.   No cramping or abdominal pain.  She reports good fetal movement, denies LOF, vaginal bleeding, vaginal itching/burning, urinary symptoms, h/a, dizziness, constipation or fever/chills.   Headache   This is a new problem. The current episode started today. The problem occurs constantly. The problem has been unchanged. The pain is located in the bilateral and frontal region. The pain does not radiate. The pain quality is similar to prior headaches. The quality of the pain is described as aching and dull. The pain is moderate. Associated symptoms include photophobia. Pertinent negatives include no abdominal pain, back pain, fever, sinus pressure, tingling or visual change. The symptoms are aggravated by bright light. She has tried oral narcotics for the symptoms. The treatment provided no relief.    RN Note: Pt C/O vomiting since yesterday evening around 1800, also diarrhea started this morning, total of two stools.  Has eaten some applesauce, has vomited everything else.  Has severe HA, has hx of migraines.  No abd pain today, denies bleeding.  Past Medical History: Past Medical History:  Diagnosis Date  . Kidney stone   . Migraine   . Miscarriage, threatened, early pregnancy 11/28/2014  . Normal intrauterine pregnancy in third trimester 02/10/2016  . SVD (spontaneous vaginal delivery) 02/11/2016    Past obstetric history: OB History  Gravida Para Term Preterm AB Living  SAB TAB Ectopic Multiple Live Births  1     0 1    # Outcome Date GA  Lbr Len/2nd Weight Sex Delivery Anes PTL Lv  3 Current           2 Term 02/11/16 [redacted]w[redacted]d 10:12 / 00:41 7 lb 2.1 oz (3.235 kg) F Vag-Spont EPI  LIV  1 SAB               Past Surgical History: Past Surgical History:  Procedure Laterality Date  . KNEE SURGERY  2008, 2009, 2010   x3    Family History: Family History  Problem Relation Age of Onset  . Arthritis Mother   . Hyperlipidemia Father   . Heart disease Father   . Cancer      prostate/grandfather    Social History: Social History  Substance Use Topics  . Smoking status: Never Smoker  . Smokeless tobacco: Never Used  . Alcohol use Yes     Comment: rare    Allergies: No Known Allergies  Meds:  No prescriptions prior to admission.    I have reviewed patient's Past Medical Hx, Surgical Hx, Family Hx, Social Hx, medications and allergies.   ROS:  Review of Systems  Constitutional: Negative for fever.  HENT: Negative for sinus pressure.   Eyes: Positive for photophobia.  Gastrointestinal: Negative for abdominal pain.  Musculoskeletal: Negative for back pain.  Neurological: Positive for headaches. Negative for tingling.   Other systems negative  Physical Exam  Patient Vitals for the past 24 hrs:  BP Temp Temp src Pulse Resp  01/27/17 1843 125/71 - - - -  01/27/17 1614 118/65 - - - -  01/27/17 1558 114/69 97.9 F (36.6 C) Oral (!) 105 18   Constitutional: Well-developed, well-nourished female in no acute distress.  Cardiovascular: normal rate and rhythm Respiratory: normal effort, clear to auscultation bilaterally GI: Abd soft, non-tender, gravid appropriate for gestational age.   No rebound or guarding. MS: Extremities nontender, no edema, normal ROM Neurologic: Alert and oriented x 4. No neurologic deficits GU: Neg CVAT.  PELVIC EXAM:  deferred  FHT:  156   Labs: Results for orders placed or performed during the hospital encounter of 01/27/17 (from the past 24 hour(s))  Urinalysis, Routine w reflex  microscopic     Status: Abnormal   Collection Time: 01/27/17  3:52 PM  Result Value Ref Range   Color, Urine YELLOW YELLOW   APPearance CLEAR CLEAR   Specific Gravity, Urine 1.020 1.005 - 1.030   pH 5.0 5.0 - 8.0   Glucose, UA NEGATIVE NEGATIVE mg/dL   Hgb urine dipstick NEGATIVE NEGATIVE   Bilirubin Urine NEGATIVE NEGATIVE   Ketones, ur 80 (A) NEGATIVE mg/dL   Protein, ur NEGATIVE NEGATIVE mg/dL   Nitrite NEGATIVE NEGATIVE   Leukocytes, UA NEGATIVE NEGATIVE   --/--/O POS, O POS (04/22 0815)  Imaging:  No results found.  MAU Course/MDM: Consult Dr Jackelyn Knife with presentation, exam findings and my plan of care.  Treatments in MAU included IV fluids (1 liter LR, 1 liter D5LR), Reglan, Benadryl and Decadron (to prevent rebound headache). Her headache over time went from a "7" to a "3".    Assessment: 1. Acute intractable headache, unspecified headache type   2.    Migraine headache 3.    Single IUP at [redacted]w[redacted]d  Plan: Discharge home Has Rx for Percocet at home Follow up in Office for prenatal visits and recheck of status  Encouraged to return here or to other Urgent Care/ED if she develops worsening of symptoms, increase in pain, fever, or other concerning symptoms.   Pt stable at time of discharge.  Wynelle Bourgeois CNM, MSN Certified Nurse-Midwife 01/27/2017 8:36 PM

## 2017-01-27 NOTE — MAU Note (Signed)
Pt C/O vomiting since yesterday evening around 1800, also diarrhea started this morning, total of two stools.  Has eaten some applesauce, has vomited everything else.  Has severe HA, has hx of migraines.  No abd pain today, denies bleeding.

## 2017-01-27 NOTE — Discharge Instructions (Signed)
Drink at least 8 8-oz glasses of water every day. Keep all your appointments in the office. Call the office if your symptoms return.

## 2017-05-15 ENCOUNTER — Inpatient Hospital Stay (HOSPITAL_COMMUNITY)
Admission: AD | Admit: 2017-05-15 | Payer: Managed Care, Other (non HMO) | Source: Ambulatory Visit | Admitting: Obstetrics and Gynecology

## 2017-06-27 LAB — OB RESULTS CONSOLE GBS: GBS: NEGATIVE

## 2017-07-05 ENCOUNTER — Encounter (HOSPITAL_COMMUNITY): Payer: Self-pay | Admitting: *Deleted

## 2017-07-05 ENCOUNTER — Telehealth (HOSPITAL_COMMUNITY): Payer: Self-pay | Admitting: *Deleted

## 2017-07-08 ENCOUNTER — Encounter (HOSPITAL_COMMUNITY): Payer: Self-pay | Admitting: *Deleted

## 2017-07-08 NOTE — Telephone Encounter (Signed)
Preadmission screen  

## 2017-07-11 ENCOUNTER — Encounter: Payer: Self-pay | Admitting: Family Medicine

## 2017-07-14 ENCOUNTER — Encounter (HOSPITAL_COMMUNITY): Payer: Self-pay | Admitting: Obstetrics

## 2017-07-14 ENCOUNTER — Inpatient Hospital Stay (HOSPITAL_COMMUNITY): Payer: PRIVATE HEALTH INSURANCE | Admitting: Anesthesiology

## 2017-07-14 ENCOUNTER — Inpatient Hospital Stay (HOSPITAL_COMMUNITY)
Admission: AD | Admit: 2017-07-14 | Discharge: 2017-07-15 | DRG: 775 | Disposition: A | Discharge status: Home / Self Care | Payer: PRIVATE HEALTH INSURANCE | Source: Ambulatory Visit | Attending: Obstetrics and Gynecology | Admitting: Obstetrics and Gynecology

## 2017-07-14 DIAGNOSIS — Z3A39 39 weeks gestation of pregnancy: Secondary | ICD-10-CM

## 2017-07-14 DIAGNOSIS — O26893 Other specified pregnancy related conditions, third trimester: Secondary | ICD-10-CM | POA: Diagnosis present

## 2017-07-14 LAB — CBC
HEMATOCRIT: 35.4 % — AB (ref 36.0–46.0)
HEMOGLOBIN: 12.2 g/dL (ref 12.0–15.0)
MCH: 30 pg (ref 26.0–34.0)
MCHC: 34.5 g/dL (ref 30.0–36.0)
MCV: 87 fL (ref 78.0–100.0)
PLATELETS: 296 10*3/uL (ref 150–400)
RBC: 4.07 MIL/uL (ref 3.87–5.11)
RDW: 13.5 % (ref 11.5–15.5)
WBC: 9.6 10*3/uL (ref 4.0–10.5)

## 2017-07-14 LAB — TYPE AND SCREEN
ABO/RH(D): O POS
Antibody Screen: NEGATIVE

## 2017-07-14 MED ORDER — EPHEDRINE 5 MG/ML INJ
10.0000 mg | INTRAVENOUS | Status: DC | PRN
  Filled 2017-07-14: qty 2

## 2017-07-14 MED ORDER — ACETAMINOPHEN 325 MG PO TABS: 650.0000 mg | ORAL_TABLET | ORAL | Status: DC | PRN

## 2017-07-14 MED ORDER — OXYTOCIN BOLUS FROM INFUSION
500.0000 mL | Freq: Once | INTRAVENOUS | Status: AC
  Administered 2017-07-14: 500 mL via INTRAVENOUS

## 2017-07-14 MED ORDER — ONDANSETRON HCL 4 MG PO TABS: 4.0000 mg | ORAL_TABLET | ORAL | Status: DC | PRN

## 2017-07-14 MED ORDER — LACTATED RINGERS IV SOLN: 500.0000 mL | INTRAVENOUS | Status: DC | PRN

## 2017-07-14 MED ORDER — TERBUTALINE SULFATE 1 MG/ML IJ SOLN
0.2500 mg | Freq: Once | INTRAMUSCULAR | Status: DC | PRN
  Filled 2017-07-14: qty 1

## 2017-07-14 MED ORDER — OXYCODONE HCL 5 MG PO TABS
5.0000 mg | ORAL_TABLET | ORAL | Status: DC | PRN
  Administered 2017-07-15: 5 mg via ORAL
  Filled 2017-07-14: qty 1

## 2017-07-14 MED ORDER — SIMETHICONE 80 MG PO CHEW: 80.0000 mg | CHEWABLE_TABLET | ORAL | Status: DC | PRN

## 2017-07-14 MED ORDER — IBUPROFEN 600 MG PO TABS
600.0000 mg | ORAL_TABLET | Freq: Four times a day (QID) | ORAL | Status: DC
  Administered 2017-07-14 – 2017-07-15 (×4): 600 mg via ORAL
  Filled 2017-07-14 (×4): qty 1

## 2017-07-14 MED ORDER — OXYCODONE-ACETAMINOPHEN 5-325 MG PO TABS: 2.0000 | ORAL_TABLET | ORAL | Status: DC | PRN

## 2017-07-14 MED ORDER — FENTANYL 2.5 MCG/ML BUPIVACAINE 1/10 % EPIDURAL INFUSION (WH - ANES)
14.0000 mL/h | INTRAMUSCULAR | Status: DC | PRN
  Administered 2017-07-14: 14 mL/h via EPIDURAL
  Filled 2017-07-14: qty 100

## 2017-07-14 MED ORDER — LIDOCAINE HCL (PF) 1 % IJ SOLN
30.0000 mL | INTRAMUSCULAR | Status: DC | PRN
  Administered 2017-07-14: 30 mL via SUBCUTANEOUS
  Filled 2017-07-14: qty 30

## 2017-07-14 MED ORDER — LACTATED RINGERS IV SOLN
500.0000 mL | Freq: Once | INTRAVENOUS | Status: AC
  Administered 2017-07-14: 500 mL via INTRAVENOUS

## 2017-07-14 MED ORDER — LIDOCAINE-EPINEPHRINE (PF) 2 %-1:200000 IJ SOLN
INTRAMUSCULAR | Status: DC | PRN
  Administered 2017-07-14: 4 mL via EPIDURAL
  Administered 2017-07-14: 3 mL via EPIDURAL

## 2017-07-14 MED ORDER — LACTATED RINGERS IV SOLN: INTRAVENOUS | Status: DC

## 2017-07-14 MED ORDER — DIBUCAINE 1 % RE OINT: 1.0000 "application " | TOPICAL_OINTMENT | RECTAL | Status: DC | PRN

## 2017-07-14 MED ORDER — OXYTOCIN 40 UNITS IN LACTATED RINGERS INFUSION - SIMPLE MED: 1.0000 m[IU]/min | INTRAVENOUS | Status: DC

## 2017-07-14 MED ORDER — PRENATAL MULTIVITAMIN CH
1.0000 | ORAL_TABLET | Freq: Every day | ORAL | Status: DC
  Administered 2017-07-15: 1 via ORAL
  Filled 2017-07-14: qty 1

## 2017-07-14 MED ORDER — OXYTOCIN 40 UNITS IN LACTATED RINGERS INFUSION - SIMPLE MED
2.5000 [IU]/h | INTRAVENOUS | Status: DC
  Filled 2017-07-14: qty 1000

## 2017-07-14 MED ORDER — COCONUT OIL OIL: 1.0000 "application " | TOPICAL_OIL | Status: DC | PRN

## 2017-07-14 MED ORDER — ONDANSETRON HCL 4 MG/2ML IJ SOLN
4.0000 mg | INTRAMUSCULAR | Status: DC | PRN
Start: 2017-07-14 — End: 2017-07-15

## 2017-07-14 MED ORDER — PHENYLEPHRINE 40 MCG/ML (10ML) SYRINGE FOR IV PUSH (FOR BLOOD PRESSURE SUPPORT)
80.0000 ug | PREFILLED_SYRINGE | INTRAVENOUS | Status: DC | PRN
  Filled 2017-07-14: qty 5

## 2017-07-14 MED ORDER — ZOLPIDEM TARTRATE 5 MG PO TABS: 5.0000 mg | ORAL_TABLET | Freq: Every evening | ORAL | Status: DC | PRN

## 2017-07-14 MED ORDER — OXYCODONE-ACETAMINOPHEN 5-325 MG PO TABS: 1.0000 | ORAL_TABLET | ORAL | Status: DC | PRN

## 2017-07-14 MED ORDER — OXYCODONE HCL 5 MG PO TABS: 10.0000 mg | ORAL_TABLET | ORAL | Status: DC | PRN

## 2017-07-14 MED ORDER — LACTATED RINGERS IV SOLN
INTRAVENOUS | Status: DC
  Administered 2017-07-14 (×2): via INTRAVENOUS

## 2017-07-14 MED ORDER — ACETAMINOPHEN 325 MG PO TABS
650.0000 mg | ORAL_TABLET | ORAL | Status: DC | PRN
  Administered 2017-07-15: 650 mg via ORAL
  Filled 2017-07-14: qty 2

## 2017-07-14 MED ORDER — WITCH HAZEL-GLYCERIN EX PADS: 1.0000 "application " | MEDICATED_PAD | CUTANEOUS | Status: DC | PRN

## 2017-07-14 MED ORDER — TETANUS-DIPHTH-ACELL PERTUSSIS 5-2.5-18.5 LF-MCG/0.5 IM SUSP: 0.5000 mL | Freq: Once | INTRAMUSCULAR | Status: DC

## 2017-07-14 MED ORDER — SOD CITRATE-CITRIC ACID 500-334 MG/5ML PO SOLN: 30.0000 mL | ORAL | Status: DC | PRN

## 2017-07-14 MED ORDER — BENZOCAINE-MENTHOL 20-0.5 % EX AERO
1.0000 "application " | INHALATION_SPRAY | CUTANEOUS | Status: DC | PRN
  Administered 2017-07-14: 1 via TOPICAL
  Filled 2017-07-14: qty 56

## 2017-07-14 MED ORDER — HYDROCORTISONE 2.5 % RE CREA
1.0000 "application " | TOPICAL_CREAM | Freq: Two times a day (BID) | RECTAL | Status: DC | PRN
  Administered 2017-07-14: 1 via TOPICAL
  Filled 2017-07-14: qty 28.35

## 2017-07-14 MED ORDER — PHENYLEPHRINE 40 MCG/ML (10ML) SYRINGE FOR IV PUSH (FOR BLOOD PRESSURE SUPPORT)
80.0000 ug | PREFILLED_SYRINGE | INTRAVENOUS | Status: DC | PRN
  Filled 2017-07-14: qty 5
  Filled 2017-07-14: qty 10

## 2017-07-14 MED ORDER — ONDANSETRON HCL 4 MG/2ML IJ SOLN
4.0000 mg | Freq: Four times a day (QID) | INTRAMUSCULAR | Status: DC | PRN
  Administered 2017-07-14: 4 mg via INTRAVENOUS
  Filled 2017-07-14: qty 2

## 2017-07-14 MED ORDER — DIPHENHYDRAMINE HCL 25 MG PO CAPS: 25.0000 mg | ORAL_CAPSULE | Freq: Four times a day (QID) | ORAL | Status: DC | PRN

## 2017-07-14 MED ORDER — FLEET ENEMA 7-19 GM/118ML RE ENEM: 1.0000 | ENEMA | RECTAL | Status: DC | PRN

## 2017-07-14 MED ORDER — SENNOSIDES-DOCUSATE SODIUM 8.6-50 MG PO TABS
2.0000 | ORAL_TABLET | ORAL | Status: DC
  Administered 2017-07-14: 2 via ORAL
  Filled 2017-07-14: qty 2

## 2017-07-14 MED ORDER — DIPHENHYDRAMINE HCL 50 MG/ML IJ SOLN: 12.5000 mg | INTRAMUSCULAR | Status: DC | PRN

## 2017-07-14 NOTE — Anesthesia Pain Management Evaluation Note (Signed)
  CRNA Pain Management Visit Note  Patient: Elizabeth Washington, 31 y.o., female  "Hello I am a member of the anesthesia team at [facility]. We have an anesthesia team available at all times to provide care throughout the hospital, including epidural management and anesthesia for C-section. I don't know your plan for the delivery whether it a natural birth, water birth, IV sedation, nitrous supplementation, doula or epidural, but we want to meet your pain goals."   1.Was your pain managed to your expectations on prior hospitalizations?   Yes   2.What is your expectation for pain management during this hospitalization?     Epidural  3.How can we help you reach that goal? Support prn  Record the patient's initial score and the patient's pain goal.   Pain: 0  Pain Goal: 3 [facility] wants you to be able to say your pain was always managed very well.  [facility] 07/14/2017

## 2017-07-14 NOTE — Anesthesia Preprocedure Evaluation (Signed)
Anesthesia Evaluation  Patient identified by MRN, date of birth, ID band Patient awake    Airway Mallampati: I  TM Distance: >3 FB Neck ROM: Full    Dental no notable dental hx. (+) Teeth Intact   Pulmonary neg pulmonary ROS,    Pulmonary exam normal        Cardiovascular negative cardio ROS Normal cardiovascular exam Rhythm:Regular     Neuro/Psych  Headaches, negative psych ROS   GI/Hepatic negative GI ROS, Neg liver ROS,   Endo/Other  negative endocrine ROS  Renal/GU Renal disease     Musculoskeletal negative musculoskeletal ROS (+)   Abdominal Normal abdominal exam  (+)   Peds negative pediatric ROS (+)  Hematology negative hematology ROS (+)   Anesthesia Other Findings   Reproductive/Obstetrics negative OB ROS                             Anesthesia Physical Anesthesia Plan  ASA: II  Anesthesia Plan: Epidural   Post-op Pain Management:    Induction:   PONV Risk Score and Plan: 1 and Ondansetron and Dexamethasone  Airway Management Planned:   Additional Equipment:   Intra-op Plan:   Post-operative Plan:   Informed Consent: I have reviewed the patients History and Physical, chart, labs and discussed the procedure including the risks, benefits and alternatives for the proposed anesthesia with the patient or authorized representative who has indicated his/her understanding and acceptance.     Plan Discussed with:   Anesthesia Plan Comments:         Anesthesia Quick Evaluation

## 2017-07-14 NOTE — Progress Notes (Signed)
Patient ID: Elizabeth Washington, female   DOB: [birth year], 31 y.o.   MRN: [id number]   Getting comfortable with epidural  AFVSS gen NAD FHTs 140's, mod var, some variables, category 2 toco q  SVE 6.5/90/0  AROM for minimal fluid/mucus.  Continue IOL Expect SVD

## 2017-07-14 NOTE — MAU Note (Signed)
Regular contraction since 0500 am every 3 minutes, no bloody show.

## 2017-07-14 NOTE — H&P (Signed)
Elizabeth Washington is a 31 y.o. female G3P1011at 39+ in labor.  Contraction increasing in intensity and frequency since 5am.  +FM, no LOF, no VB, Relatively uncomplicated PNC, except for HA.  Tdap 04/25/17.  GBBS negative  OB History    Gravida Para Term Preterm AB Living   SAB TAB Ectopic Multiple Live Births   1     0 1    G1 SAB G2 SVD 4/17, 7#2, female G3 present  +abn pap, last 6/17, HR HPV + No STDs  Past Medical History:  Diagnosis Date  . Kidney stone   . Migraine   . Miscarriage, threatened, early pregnancy 11/28/2014  . Normal intrauterine pregnancy in third trimester 02/10/2016  . SVD (spontaneous vaginal delivery) 02/11/2016   Past Surgical History:  Procedure Laterality Date  . KNEE SURGERY  2008, 2009, 2010   x3   Family History: family history includes Arthritis in her mother; Cancer in her unknown relative; Heart disease in her father; Hyperlipidemia in her father. Social History:  reports that she has never smoked. She has never used smokeless tobacco. She reports that she drinks alcohol. She reports that she does not use drugs. parks and rec, married  Meds PNV All NKDA     Maternal Diabetes: No Genetic Screening: Normal Maternal Ultrasounds/Referrals: Normal Fetal Ultrasounds or other Referrals:  None Maternal Substance Abuse:  No Significant Maternal Medications:  None Significant Maternal Lab Results:  Lab values include: Group B Strep negative Other Comments:  nl essential panel  Review of Systems  Constitutional: Negative.   HENT: Negative.   Eyes: Negative.   Respiratory: Negative.   Cardiovascular: Negative.   Gastrointestinal: Negative.   Genitourinary: Negative.   Musculoskeletal: Positive for back pain.  Skin: Negative.   Neurological: Negative.   Psychiatric/Behavioral: Negative.    Maternal Medical History:  Reason for admission: Contractions.   Contractions: Frequency: regular.    Fetal activity: Perceived fetal  activity is normal.    Prenatal Complications - Diabetes: none.    Dilation: 4 Effacement (%): 70 Station: -2 Exam by:: Millner, RN  Blood pressure 137/90, pulse 69, temperature 99.1 F (37.3 C), temperature source Oral, resp. rate 18, height  (1.727 m), weight 94.3 kg (208 lb), unknown if currently breastfeeding. Maternal Exam:  Uterine Assessment: Contraction strength is moderate.  Contraction frequency is regular.   Abdomen: Fundal height is appropriate for gestation.   Estimated fetal weight is 7#.   Fetal presentation: vertex  Introitus: Normal vulva. Normal vagina.  Cervix: Cervix evaluated by digital exam.     Physical Exam  Constitutional: She is oriented to person, place, and time. She appears well-developed and well-nourished.  HENT:  Head: Normocephalic and atraumatic.  Cardiovascular: Normal rate and regular rhythm.   Respiratory: Effort normal and breath sounds normal. No respiratory distress. She has no wheezes.  GI: Soft. Bowel sounds are normal. She exhibits no distension. There is no tenderness.  Musculoskeletal: Normal range of motion.  Neurological: She is alert and oriented to person, place, and time.  Skin: Skin is warm and dry.  Psychiatric: She has a normal mood and affect. Her behavior is normal.    Prenatal labs: ABO, Rh: O/Positive/-- (02/23 0000) Antibody: Negative (02/23 0000) Rubella: Immune (02/23 0000) RPR: Nonreactive (02/23 0000)  HBsAg: Negative (02/23 0000)  HIV: Non-reactive (02/23 0000)  GBS: Negative (09/06 0000)   Hgb 14.3/Plt 245/Ur Cx neg/ GC neg/Chl neg/Varicella neg/First trimester screen  WNL/glucola 115 Essential panel neg [place] dated by first trimester [place] Nl anat, prominent kidney, ant pac, female  F/u WNL, good growth  Assessment/Plan: 30yo G3P1011 G3P1011 at 39+ for labor and cervical change Admit to L&D Epidural prn Pitocin prn to augment Expect SVD   Elizabeth Washington 07/14/2017, 11:14 AM

## 2017-07-14 NOTE — Anesthesia Procedure Notes (Signed)
Epidural Patient location during procedure: OB Start time: 07/14/2017 11:18 AM  Staffing Anesthesiologist: Odette Fraction Performed: anesthesiologist   Preanesthetic Checklist Completed: patient identified, site marked, surgical consent, pre-op evaluation, timeout performed, IV checked, risks and benefits discussed and monitors and equipment checked  Epidural Patient position: sitting Prep: ChloraPrep Patient monitoring: heart rate, continuous pulse ox and blood pressure Location: L3-L4 Injection technique: LOR saline  Needle:  Needle type: Tuohy  Needle gauge: 17 G Needle length: 9 cm and 9 Needle insertion depth: 7 cm Catheter type: closed end flexible Catheter size: 20 Guage Catheter at skin depth: 14 cm Test dose: negative and 2% lidocaine with Epi 1:200 K  Assessment Events: blood not aspirated, injection not painful, no injection resistance, negative IV test and no paresthesia  Additional Notes Patient identified. Risks/Benefits/Options discussed with patient including but not limited to bleeding, infection, nerve damage, paralysis, failed block, incomplete pain control, headache, blood pressure changes, nausea, vomiting, reactions to medication both or allergic, itching and postpartum back pain. Confirmed with bedside nurse the patient's most recent platelet count. Confirmed with patient that they are not currently taking any anticoagulation, have any bleeding history or any family history of bleeding disorders. Patient expressed understanding and wished to proceed. All questions were answered. Sterile technique was used throughout the entire procedure. Please see nursing notes for vital signs. Test dose was given through epidural catheter and negative prior to continuing to dose epidural or start infusion.   All questions and concerns addressed with instructions to call with any issues.

## 2017-07-14 NOTE — Progress Notes (Signed)
Patient ID: Elizabeth Washington, female   DOB: 16-Oct-1986, 31 y.o.   MRN: [id number]   Pt more uncomfortable with pressure  AFVSS gen NAD FHTs 140's, mod var, some variable decels, category 2 toco Q  mVu looks adequate 9/90/+1-2  More than likely pt progressing very quickly, expect SVD/pushing soon.

## 2017-07-14 NOTE — Progress Notes (Signed)
Patient ID: Elizabeth Washington, female   DOB: 23-Jan-1986, 31 y.o.   MRN: [id number]   Some pressure/discomfort with ctx.    AFVSS gen NAD FHTs 135, mod var, some variables/late decel, category 2 toco q 3 min  AROM of forebag for meconium - d/w pt. 7.5/90/+1  IUPC placed. W/o diff/comp  Continue close monitoring, expect SVD Poss amnioinfusion

## 2017-07-15 ENCOUNTER — Inpatient Hospital Stay (HOSPITAL_COMMUNITY): Admission: RE | Admit: 2017-07-15 | Payer: Managed Care, Other (non HMO) | Source: Ambulatory Visit

## 2017-07-15 LAB — CBC
HCT: 29.5 % — ABNORMAL LOW (ref 36.0–46.0)
Hemoglobin: 9.8 g/dL — ABNORMAL LOW (ref 12.0–15.0)
MCH: 29.5 pg (ref 26.0–34.0)
MCHC: 33.2 g/dL (ref 30.0–36.0)
MCV: 88.9 fL (ref 78.0–100.0)
PLATELETS: 198 10*3/uL (ref 150–400)
RBC: 3.32 MIL/uL — ABNORMAL LOW (ref 3.87–5.11)
RDW: 13.7 % (ref 11.5–15.5)
WBC: 9.2 10*3/uL (ref 4.0–10.5)

## 2017-07-15 LAB — RPR: RPR: NONREACTIVE

## 2017-07-15 MED ORDER — PRENATAL MULTIVITAMIN CH: 1.0000 | ORAL_TABLET | Freq: Every day | ORAL | 3 refills | Status: DC

## 2017-07-15 MED ORDER — IBUPROFEN 800 MG PO TABS: 800.0000 mg | ORAL_TABLET | Freq: Three times a day (TID) | ORAL | 1 refills | Status: AC | PRN

## 2017-07-15 NOTE — Plan of Care (Signed)
Problem: Education: Goal: Knowledge of condition will improve Discharge education, safety and follow up reviewed with patient. Patient verbalizes understanding.    

## 2017-07-15 NOTE — Progress Notes (Addendum)
Post Partum Day 1 Subjective: no complaints, up ad lib, voiding, tolerating PO and nl lochia, pain controlled  Objective: Blood pressure 120/78, pulse 67, temperature 97.8 F (36.6 C), temperature source Oral, resp. rate 18, height  (1.727 m), weight 94.3 kg (208 lb), SpO2 100 %, unknown if currently breastfeeding.  Physical Exam:  General: alert and no distress Lochia: appropriate Uterine Fundus: firm  Recent Labs  07/14/17 1039 07/15/17 0541  HGB 12.2 9.8*  HCT 35.4* 29.5*    Assessment/Plan: Plan for discharge tomorrow, Breastfeeding and Lactation consult.  Routine PP care Pt desires d/c to home - will let peds know - poss d/c this pm.  D/C with Motrin and PNV, f/u 6 weeks.     LOS: 1 day   Gurinder Toral Bovard-Stuckert 07/15/2017, 7:39 AM

## 2017-07-15 NOTE — Lactation Note (Signed)
This note was copied from a baby's chart. Lactation Consultation Note Mom, baby, and support person sleeping soundly. Patient Name: Elizabeth Washington RUEAV'W Date: 07/15/2017     Maternal Data    Feeding    LATCH Score                   Interventions    Lactation Tools Discussed/Used     Consult Status      Charyl Dancer 07/15/2017, 3:23 AM

## 2017-07-15 NOTE — Discharge Summary (Signed)
OB Discharge Summary     Patient Name: Elizabeth Washington DOB: 09-21-1986 MRN: [id number]  Date of admission: 07/14/2017 Delivering MD: Sherian Rein   Date of discharge: 07/15/2017  Admitting diagnosis: 39.1wks CTX less Intrauterine pregnancy: [redacted]w[redacted]d     Secondary diagnosis:  Principal Problem:   SVD (spontaneous vaginal delivery) Active Problems:   Normal labor  Additional problems: N/A     Discharge diagnosis: Term Pregnancy Delivered                                                                                                Post partum procedures:N/A  Augmentation: AROM  Complications: None  Hospital course:  Onset of Labor With Vaginal Delivery     31 y.o. yo [id number] at [redacted]w[redacted]d was admitted in Active labor on 07/14/2017. Patient had an uncomplicated labor course as follows:  Membrane Rupture Time/Date: 1:35 PM ,07/14/2017   Intrapartum Procedures: Episiotomy: None [1]                                         Lacerations:  2nd degree [3]  Patient had a delivery of a Viable infant. 07/14/2017  Information for the patient's newborn:  Emmaly, Leech [[id number]]  Delivery Method: Vaginal, Spontaneous Delivery (Filed from Delivery Summary)    Pateint had an uncomplicated postpartum course.  She is ambulating, tolerating a regular diet, passing flatus, and urinating well. Patient is discharged home in stable condition on 07/15/17.   Physical exam  Vitals:   07/14/17 1555 07/14/17 1655 07/14/17 2049 07/15/17 0500  BP: 126/75 129/82 128/68 120/78  Pulse: 65 71 75 67  Resp: Temp: 98.9 F (37.2 C) 98.5 F (36.9 C) 98.7 F (37.1 C) 97.8 F (36.6 C)  TempSrc: Oral Oral Oral Oral  SpO2: 100% 100%    Weight:      Height:       General: alert and no distress Lochia: appropriate Uterine Fundus: firm  Lab Results  Component Value Date   WBC 9.2 07/15/2017   HGB 9.8 (L) 07/15/2017   HCT 29.5 (L) 07/15/2017   MCV 88.9 07/15/2017   PLT  198 07/15/2017   CMP Latest Ref Rng & Units 07/08/2015  Glucose 65 - 99 mg/dL 83  BUN 6 - 20 mg/dL 12  Creatinine 1.30 - 8.65 mg/dL 7.84  Sodium 696 - 295 mmol/L 135  Potassium 3.5 - 5.1 mmol/L 4.3  Chloride 101 - 111 mmol/L 105  CO2 22 - 32 mmol/L 23  Calcium 8.9 - 10.3 mg/dL 2.8(U)  Total Protein 6.5 - 8.1 g/dL 7.0  Total Bilirubin 0.3 - 1.2 mg/dL 0.5  Alkaline Phos 38 - 126 U/L 39  AST 15 - 41 U/L 17  ALT 14 - 54 U/L 18    Discharge instruction: per After Visit Summary and "Baby and Me Booklet".  After visit meds:  Allergies as of 07/15/2017   No Known Allergies     Medication List  TAKE these medications   acetaminophen 500 MG tablet Commonly known as:  TYLENOL Take 1,000 mg by mouth every 6 (six) hours as needed for mild pain, moderate pain, fever or headache.   ibuprofen 800 MG tablet Commonly known as:  ADVIL,MOTRIN Take 1 tablet (800 mg total) by mouth every 8 (eight) hours as needed.   prenatal multivitamin Tabs tablet Take 1 tablet by mouth daily at 12 noon.   PROCTOZONE-HC 2.5 % rectal cream Generic drug:  hydrocortisone Apply 1 application topically 2 (two) times daily as needed for hemorrhoids.            Discharge Care Instructions        Start     Ordered   07/15/17 0000  ibuprofen (ADVIL,MOTRIN) 800 MG tablet  Every 8 hours PRN     07/15/17 0804   07/15/17 0000  Prenatal Vit-Fe Fumarate-FA (PRENATAL MULTIVITAMIN) TABS tablet  Daily     07/15/17 0804   07/15/17 0000  Increase activity slowly     07/15/17 0804   07/15/17 0000  Diet - low sodium heart healthy     07/15/17 0804   07/15/17 0000  Discharge instructions    Comments:  Call [phone] with questions or problems   07/15/17 0804   07/15/17 0000  May walk up steps     07/15/17 0804   07/15/17 0000  May shower / Bathe     07/15/17 0804   07/15/17 0000  Driving Restrictions    Comments:  While taking strong pain medicine   07/15/17 0804   07/15/17 0000  Sexual Activity  Restrictions    Comments:  Pelvic rest - no douching, tampons or sex for 6 weeks   07/15/17 0804   07/15/17 0000  Lifting restrictions    Comments:  No greater than 10-15lbs for 2-3 weeks   07/15/17 0804   07/15/17 0000  Call MD for:  persistant nausea and vomiting     07/15/17 0804   07/15/17 0000  Call MD for:  severe uncontrolled pain     07/15/17 0804      Diet: routine diet  Activity: Advance as tolerated. Pelvic rest for 6 weeks.   Outpatient follow up:6 weeks Follow up Appt:No future appointments. Follow up Visit:No Follow-up on file.  Postpartum contraception: Undecided  Newborn Data: Live born female  Birth Weight: 5 lb 11.5 oz (2595 g) APGAR: 9, 9  Baby Feeding: Breast by pump Disposition:home with mother   07/15/2017 Sherian Rein, MD

## 2017-07-15 NOTE — Anesthesia Postprocedure Evaluation (Signed)
Anesthesia Post Note  Patient: Elizabeth Washington  Procedure(s) Performed: * No procedures listed *     Patient location during evaluation: Mother Baby Anesthesia Type: Epidural Level of consciousness: awake and alert and oriented Pain management: satisfactory to patient Vital Signs Assessment: post-procedure vital signs reviewed and stable Respiratory status: spontaneous breathing and nonlabored ventilation Cardiovascular status: stable Postop Assessment: no headache, no backache, no signs of nausea or vomiting, adequate PO intake and patient able to bend at knees (patient up walking) Anesthetic complications: no    Last Vitals:  Vitals:   07/14/17 2049 07/15/17 0500  BP: 128/68 120/78  Pulse: 75 67  Resp: 18 18  Temp: 37.1 C 36.6 C  SpO2:      Last Pain:  Vitals:   07/15/17 0556  TempSrc:   PainSc: 0-No pain   Pain Goal: Patients Stated Pain Goal: 3 (07/14/17 1655)               Madison Hickman

## 2017-07-15 NOTE — Lactation Note (Signed)
This note was copied from a baby's chart. Lactation Consultation Note  Patient Name: Elizabeth Washington OHYWV'P Date: 07/15/2017   Mom has a Medela PIS at home. She plans to pump & BO. With her 1st child, her milk came to volume around 48 hours & she pumped & BO for 8 weeks. She was typically able to pump 4oz+ per pumping session.  Mom's nipples were assessed. She needs size 21 flanges, which were provided. Mom's L nipple appears to have a possible compression stripe. She finds nursing uncomfortable, thus the decision to pump & BO.   Mom was shown how to assemble & use hand pump that was included in pump kit.   Matthias Hughs [facility] 07/15/2017, 3:57 PM

## 2017-10-01 ENCOUNTER — Encounter (HOSPITAL_COMMUNITY): Payer: Self-pay | Admitting: Emergency Medicine

## 2017-10-01 ENCOUNTER — Ambulatory Visit (HOSPITAL_COMMUNITY)
Admission: EM | Admit: 2017-10-01 | Discharge: 2017-10-01 | Disposition: A | Discharge status: Home / Self Care | Payer: PRIVATE HEALTH INSURANCE | Attending: Physician Assistant | Admitting: Physician Assistant

## 2017-10-01 ENCOUNTER — Other Ambulatory Visit: Payer: Self-pay

## 2017-10-01 DIAGNOSIS — J0140 Acute pansinusitis, unspecified: Secondary | ICD-10-CM

## 2017-10-01 MED ORDER — CETIRIZINE-PSEUDOEPHEDRINE ER 5-120 MG PO TB12: 1.0000 | ORAL_TABLET | Freq: Every day | ORAL | 0 refills | Status: DC

## 2017-10-01 MED ORDER — FLUTICASONE PROPIONATE 50 MCG/ACT NA SUSP: 2.0000 | Freq: Every day | NASAL | 0 refills | Status: DC

## 2017-10-01 MED ORDER — AMOXICILLIN-POT CLAVULANATE 875-125 MG PO TABS: 1.0000 | ORAL_TABLET | Freq: Two times a day (BID) | ORAL | 0 refills | Status: DC

## 2017-10-01 NOTE — ED Provider Notes (Signed)
[facility]    CSN: [id number] Arrival date & time: 10/01/17  1001     History   Chief Complaint Chief Complaint  Patient presents with  . Facial Pain    HPI Elizabeth Washington is a 31 y.o. female.   31 year old female comes in for 2 week history of URI symptoms. She has had nasal congestion, rhinorrhea, sneezing, sinus pressure/drainage, cough. Now having headache, upper jaw pain, popping of the ears. Denies fever, chills, night sweats. Denies photophobia, nausea, vomiting. otc cold medications. Has had history of sinus infections. Positive sick contact. Never smoker.       Past Medical History:  Diagnosis Date  . Kidney stone   . Migraine   . Miscarriage, threatened, early pregnancy 11/28/2014  . Normal intrauterine pregnancy in third trimester 02/10/2016  . SVD (spontaneous vaginal delivery) 02/11/2016  . SVD (spontaneous vaginal delivery) 07/14/2017    Patient Active Problem List   Diagnosis Date Noted  . Normal labor 07/14/2017  . SVD (spontaneous vaginal delivery) 07/14/2017  . Other normal pregnancy, not first 02/11/2016  . Normal intrauterine pregnancy in third trimester 02/10/2016  . Miscarriage, threatened, early pregnancy 11/28/2014  . HEMORRHOIDS, EXTERNAL 08/10/2010  . MIGRAINE HEADACHE 07/07/2010  . CHEST WALL PAIN, ANTERIOR 07/07/2010    Past Surgical History:  Procedure Laterality Date  . KNEE SURGERY  2008, 2009, 2010   x3    OB History    Gravida Para Term Preterm AB Living   3 2 2   1 2    SAB TAB Ectopic Multiple Live Births   1     0 2       Home Medications    Prior to Admission medications   Medication Sig Start Date End Date Taking? Authorizing Provider  acetaminophen (TYLENOL) 500 MG tablet Take 1,000 mg by mouth every 6 (six) hours as needed for mild pain, moderate pain, fever or headache.    [provider]  amoxicillin-clavulanate (AUGMENTIN) 875-125 MG tablet Take 1 tablet by mouth every 12 (twelve) hours.  10/01/17   Cathie HoopsYu, Amy V, PA-C  cetirizine-pseudoephedrine (ZYRTEC-D) 5-120 MG tablet Take 1 tablet by mouth daily. 10/01/17   Cathie HoopsYu, Amy V, PA-C  fluticasone (FLONASE) 50 MCG/ACT nasal spray Place 2 sprays into both nostrils daily. 10/01/17   Belinda FisherYu, Amy V, PA-C  hydrocortisone (PROCTOZONE-HC) 2.5 % rectal cream Apply 1 application topically 2 (two) times daily as needed for hemorrhoids.     [provider]  ibuprofen (ADVIL,MOTRIN) 800 MG tablet Take 1 tablet (800 mg total) by mouth every 8 (eight) hours as needed. 07/15/17   Bovard-Stuckert, Augusto GambleJody, MD  Prenatal Vit-Fe Fumarate-FA (PRENATAL MULTIVITAMIN) TABS tablet Take 1 tablet by mouth daily at 12 noon. 07/15/17   Bovard-StuckertAugusto Gamble, Jody, MD    Family History Family History  Problem Relation Age of Onset  . Arthritis Mother   . Hyperlipidemia Father   . Heart disease Father   . Cancer Unknown        prostate/grandfather    Social History Social History   Tobacco Use  . Smoking status: Never Smoker  . Smokeless tobacco: Never Used  Substance Use Topics  . Alcohol use: Yes    Comment: rare  . Drug use: No     Allergies   Patient has no known allergies.   Review of Systems Review of Systems  Reason unable to perform ROS: See HPI as above.     Physical Exam Triage Vital Signs ED Triage  Vitals  Enc Vitals Group     BP 10/01/17 1012 127/84     Pulse Rate 10/01/17 1012 80     Resp 10/01/17 1012 16     Temp 10/01/17 1012 98.2 F (36.8 C)     Temp Source 10/01/17 1012 Oral     SpO2 10/01/17 1012 96 %     Weight --      Height --      Head Circumference --      Peak Flow --      Pain Score 10/01/17 1014 7     Pain Loc --      Pain Edu? --      Excl. in GC? --    No data found.  Updated Vital Signs BP 127/84 (BP Location: Right Arm)   Pulse 80   Temp 98.2 F (36.8 C) (Oral)   Resp 16   SpO2 96%   Breastfeeding? No   Physical Exam  Constitutional: She is oriented to person, place, and time. She appears  well-developed and well-nourished. No distress.  HENT:  Head: Normocephalic and atraumatic.  Right Ear: Tympanic membrane, external ear and ear canal normal. Tympanic membrane is not erythematous and not bulging.  Left Ear: Tympanic membrane, external ear and ear canal normal. Tympanic membrane is not erythematous and not bulging.  Nose: Mucosal edema and rhinorrhea present. Right sinus exhibits maxillary sinus tenderness and frontal sinus tenderness. Left sinus exhibits maxillary sinus tenderness and frontal sinus tenderness.  Mouth/Throat: Uvula is midline, oropharynx is clear and moist and mucous membranes are normal.  Eyes: Conjunctivae are normal. Pupils are equal, round, and reactive to light.  Neck: Normal range of motion. Neck supple.  Cardiovascular: Normal rate, regular rhythm and normal heart sounds. Exam reveals no gallop and no friction rub.  No murmur heard. Pulmonary/Chest: Effort normal and breath sounds normal. She has no decreased breath sounds. She has no wheezes. She has no rhonchi. She has no rales.  Lymphadenopathy:    She has no cervical adenopathy.  Neurological: She is alert and oriented to person, place, and time.  Skin: Skin is warm and dry.  Psychiatric: She has a normal mood and affect. Her behavior is normal. Judgment normal.     UC Treatments / Results  Labs (all labs ordered are listed, but only abnormal results are displayed) Labs Reviewed - No data to display  EKG  EKG Interpretation None       Radiology No results found.  Procedures Procedures (including critical care time)  Medications Ordered in UC Medications - No data to display   Initial Impression / Assessment and Plan / UC Course  I have reviewed the triage vital signs and the nursing notes.  Pertinent labs & imaging results that were available during my care of the patient were reviewed by me and considered in my medical decision making (see chart for details).    Augmentin  for sinusitis. Other symptomatic treatment discussed. Return precautions given.   Final Clinical Impressions(s) / UC Diagnoses   Final diagnoses:  Acute non-recurrent pansinusitis    ED Discharge Orders        Ordered    amoxicillin-clavulanate (AUGMENTIN) 875-125 MG tablet  Every 12 hours,   Status:  Discontinued     10/01/17 1024    fluticasone (FLONASE) 50 MCG/ACT nasal spray  Daily,   Status:  Discontinued     10/01/17 1024    cetirizine-pseudoephedrine (ZYRTEC-D) 5-120 MG tablet  Daily,   Status:  Discontinued     10/01/17 1024    amoxicillin-clavulanate (AUGMENTIN) 875-125 MG tablet  Every 12 hours     10/01/17 1030    cetirizine-pseudoephedrine (ZYRTEC-D) 5-120 MG tablet  Daily     10/01/17 1030    fluticasone (FLONASE) 50 MCG/ACT nasal spray  Daily     10/01/17 1030        [address] [place] 10/01/17 1035

## 2017-10-01 NOTE — ED Triage Notes (Signed)
Pt complains of cold symptoms since Thanksgiving.  Starting Saturday, she reports sinus pressure and drainage along with a headache, upper jaw pain , and popping your ears.

## 2017-10-01 NOTE — Discharge Instructions (Addendum)
Start Augmentin for sinus infection. Start flonase, zyrtec-D for nasal congestion. You can use over the counter nasal saline rinse such as neti pot for nasal congestion. Keep hydrated, your urine should be clear to pale yellow in color. Tylenol/motrin for fever and pain. Monitor for any worsening of symptoms, chest pain, shortness of breath, wheezing, swelling of the throat, follow up for reevaluation.

## 2018-01-14 ENCOUNTER — Ambulatory Visit: Payer: PRIVATE HEALTH INSURANCE | Admitting: Physician Assistant

## 2018-01-14 ENCOUNTER — Encounter (HOSPITAL_COMMUNITY): Payer: Self-pay

## 2018-01-14 ENCOUNTER — Telehealth: Payer: Self-pay | Admitting: *Deleted

## 2018-01-14 ENCOUNTER — Emergency Department (HOSPITAL_COMMUNITY)
Admission: EM | Admit: 2018-01-14 | Discharge: 2018-01-14 | Disposition: A | Discharge status: Home / Self Care | Payer: PRIVATE HEALTH INSURANCE | Attending: Emergency Medicine | Admitting: Emergency Medicine

## 2018-01-14 DIAGNOSIS — R51 Headache: Secondary | ICD-10-CM | POA: Diagnosis not present

## 2018-01-14 DIAGNOSIS — Z79899 Other long term (current) drug therapy: Secondary | ICD-10-CM | POA: Diagnosis not present

## 2018-01-14 DIAGNOSIS — R519 Headache, unspecified: Secondary | ICD-10-CM

## 2018-01-14 MED ORDER — METOCLOPRAMIDE HCL 10 MG PO TABS
10.0000 mg | ORAL_TABLET | Freq: Once | ORAL | Status: AC
  Administered 2018-01-14: 10 mg via ORAL
  Filled 2018-01-14: qty 1

## 2018-01-14 MED ORDER — DIPHENHYDRAMINE HCL 25 MG PO CAPS
50.0000 mg | ORAL_CAPSULE | Freq: Once | ORAL | Status: AC
  Administered 2018-01-14: 50 mg via ORAL
  Filled 2018-01-14: qty 2

## 2018-01-14 MED ORDER — KETOROLAC TROMETHAMINE 30 MG/ML IJ SOLN
30.0000 mg | Freq: Once | INTRAMUSCULAR | Status: AC
Start: 2018-01-14 — End: 2018-01-14
  Administered 2018-01-14: 30 mg via INTRAMUSCULAR
  Filled 2018-01-14: qty 1

## 2018-01-14 NOTE — Telephone Encounter (Signed)
Spoke to pt, told her I saw she was on my schedule  and Lelon MastSamantha the provider said you would need to go back to the ED because we do not have IV fluids here and will need an MRI. Also due to family history of aneurysm. Pt verbalized understanding and said her mother wanted to talk to me. Spoke to pt's mother Dennie Bibleat explained to her why she needed to go back to the ED. Mother understood and said this is the worse headache her daughter ever had and she is very concerned due to mother's brother had cerebral aneurysm and she is having the same symptoms. Told her it is very important she goes to the ED.  She asked if we could call ahead to see if she can be seen sooner, [facility] Told her I will try to call ahead and speak to someone. Pat verbalized understanding.   Samantha called [place] and spoke to LorainJessica gave her pt's info and symptoms. Shanda BumpsJessica said pt will need to be triaged like all other patients and brought back accordingly.

## 2018-01-14 NOTE — ED Triage Notes (Signed)
PT arrives to ED with band like sensation around head since 6pm last night. PT endorses n/v/ sensitivity. Pt reports hx of migraines. Symptoms have let up some at this time. NAD. VSS. Neuro exam normal.

## 2018-01-14 NOTE — ED Provider Notes (Signed)
[facility] Provider Note   CSN: [id number] Arrival date & time: 01/14/18  1458     History   Chief Complaint Chief Complaint  Patient presents with  . Migraine    HPI Elizabeth Washington is a 32 y.o. female.  Patient presents to the emergency department with a chief complaint of headache.  She has a history of migraines.  She states that this headache started p.m. last night.  It has gradually worsened.  She has taken oxycodone, Aleve, and Excedrin Migraine.  She states that the symptoms feel like a band tightening around her head.  She reports associated nausea and vomiting and photophobia.  She denies any fevers, chills, or neck stiffness.  States that since being in the waiting room her symptoms have improved.  She questioned whether or not to even check in.  She states that her symptoms are now 4 out of 10.  The history is provided by the patient. No language interpreter was used.    Past Medical History:  Diagnosis Date  . Kidney stone   . Migraine   . Miscarriage, threatened, early pregnancy 11/28/2014  . Normal intrauterine pregnancy in third trimester 02/10/2016  . SVD (spontaneous vaginal delivery) 02/11/2016  . SVD (spontaneous vaginal delivery) 07/14/2017    Patient Active Problem List   Diagnosis Date Noted  . Normal labor 07/14/2017  . SVD (spontaneous vaginal delivery) 07/14/2017  . Other normal pregnancy, not first 02/11/2016  . Normal intrauterine pregnancy in third trimester 02/10/2016  . Miscarriage, threatened, early pregnancy 11/28/2014  . HEMORRHOIDS, EXTERNAL 08/10/2010  . MIGRAINE HEADACHE 07/07/2010  . CHEST WALL PAIN, ANTERIOR 07/07/2010    Past Surgical History:  Procedure Laterality Date  . KNEE SURGERY  2008, 2009, 2010   x3     OB History    Gravida  3   Para  2   Term  2   Preterm      AB  1   Living  2     SAB  1   TAB      Ectopic      Multiple  0   Live Births  2             Home Medications    Prior to Admission medications   Medication Sig Start Date End Date Taking? Authorizing Provider  acetaminophen (TYLENOL) 500 MG tablet Take 1,000 mg by mouth every 6 (six) hours as needed for mild pain, moderate pain, fever or headache.    [provider]  amoxicillin-clavulanate (AUGMENTIN) 875-125 MG tablet Take 1 tablet by mouth every 12 (twelve) hours. 10/01/17   Cathie HoopsYu, Amy V, PA-C  cetirizine-pseudoephedrine (ZYRTEC-D) 5-120 MG tablet Take 1 tablet by mouth daily. 10/01/17   Cathie HoopsYu, Amy V, PA-C  fluticasone (FLONASE) 50 MCG/ACT nasal spray Place 2 sprays into both nostrils daily. 10/01/17   Belinda FisherYu, Amy V, PA-C  hydrocortisone (PROCTOZONE-HC) 2.5 % rectal cream Apply 1 application topically 2 (two) times daily as needed for hemorrhoids.     [provider]  ibuprofen (ADVIL,MOTRIN) 800 MG tablet Take 1 tablet (800 mg total) by mouth every 8 (eight) hours as needed. 07/15/17   Bovard-Stuckert, Augusto GambleJody, MD  Prenatal Vit-Fe Fumarate-FA (PRENATAL MULTIVITAMIN) TABS tablet Take 1 tablet by mouth daily at 12 noon. 07/15/17   Bovard-StuckertAugusto Gamble, Jody, MD    Family History Family History  Problem Relation Age of Onset  . Arthritis Mother   . Hyperlipidemia Father   .  Heart disease Father   . Cancer Unknown        prostate/grandfather    Social History Social History   Tobacco Use  . Smoking status: Never Smoker  . Smokeless tobacco: Never Used  Substance Use Topics  . Alcohol use: Yes    Comment: rare  . Drug use: No     Allergies   Patient has no known allergies.   Review of Systems Review of Systems  All other systems reviewed and are negative.    Physical Exam Updated Vital Signs BP (!) 127/92 (BP Location: Right Arm)   Pulse 67   Temp 97.8 F (36.6 C) (Oral)   Resp 14   Ht 5\' 8"  (1.727 m)   Wt 77.1 kg (170 lb)   LMP 01/03/2018   SpO2 97%   BMI 25.85 kg/m   Physical Exam  Constitutional: She is oriented to person, place, and  time. She appears well-developed and well-nourished.  HENT:  Head: Normocephalic and atraumatic.  Right Ear: External ear normal.  Left Ear: External ear normal.  Eyes: Pupils are equal, round, and reactive to light. Conjunctivae and EOM are normal.  Neck: Normal range of motion. Neck supple.  No pain with neck flexion, no meningismus  Cardiovascular: Normal rate, regular rhythm and normal heart sounds. Exam reveals no gallop and no friction rub.  No murmur heard. Pulmonary/Chest: Effort normal and breath sounds normal. No respiratory distress. She has no wheezes. She has no rales. She exhibits no tenderness.  Abdominal: Soft. She exhibits no distension and no mass. There is no tenderness. There is no rebound and no guarding.  Musculoskeletal: Normal range of motion. She exhibits no edema or tenderness.  Normal gait.  Neurological: She is alert and oriented to person, place, and time. She has normal reflexes.  CN 3-12 intact, normal finger to nose, no pronator drift, sensation and strength intact bilaterally.  Skin: Skin is warm and dry.  Psychiatric: She has a normal mood and affect. Her behavior is normal. Judgment and thought content normal.  Nursing note and vitals reviewed.    ED Treatments / Results  Labs (all labs ordered are listed, but only abnormal results are displayed) Labs Reviewed - No data to display  EKG None  Radiology No results found.  Procedures Procedures (including critical care time)  Medications Ordered in ED Medications  ketorolac (TORADOL) 30 MG/ML injection 30 mg (has no administration in time range)  metoCLOPramide (REGLAN) tablet 10 mg (has no administration in time range)  diphenhydrAMINE (BENADRYL) capsule 50 mg (has no administration in time range)     Initial Impression / Assessment and Plan / ED Course  I have reviewed the triage vital signs and the nursing notes.  Pertinent labs & imaging results that were available during my care of  the patient were reviewed by me and considered in my medical decision making (see chart for details).     Pt HA treated and improved while in ED.  Presentation is like pts typical HA and non concerning for [facility], ICH, Meningitis, or temporal arteritis. Pt is afebrile with no focal neuro deficits, nuchal rigidity, or change in vision. Pt is to follow up with PCP to discuss prophylactic medication. Pt verbalizes understanding and is agreeable with plan to dc.   Offered to observe patient to ensure that medication offer some relief, patient declines, and states that she would rather just go home.  Return precautions given.  Final Clinical Impressions(s) / ED Diagnoses  Final diagnoses:  Nonintractable headache, unspecified chronicity pattern, unspecified headache type    ED Discharge Orders    None       Roxy Horseman, PA-C 01/14/18 1550    Pricilla Loveless, MD 01/14/18 [phone]

## 2018-01-16 ENCOUNTER — Ambulatory Visit (INDEPENDENT_AMBULATORY_CARE_PROVIDER_SITE_OTHER): Payer: PRIVATE HEALTH INSURANCE | Admitting: Family Medicine

## 2018-01-16 ENCOUNTER — Encounter: Payer: Self-pay | Admitting: Family Medicine

## 2018-01-16 VITALS — BP 118/70 | HR 83 | Temp 98.3°F | Ht 68.0 in | Wt 177.0 lb

## 2018-01-16 DIAGNOSIS — J0100 Acute maxillary sinusitis, unspecified: Secondary | ICD-10-CM

## 2018-01-16 DIAGNOSIS — G43009 Migraine without aura, not intractable, without status migrainosus: Secondary | ICD-10-CM

## 2018-01-16 MED ORDER — KETOROLAC TROMETHAMINE 60 MG/2ML IM SOLN
60.0000 mg | Freq: Once | INTRAMUSCULAR | Status: AC
  Administered 2018-01-16: 60 mg via INTRAMUSCULAR

## 2018-01-16 MED ORDER — TOPIRAMATE 50 MG PO TABS: 50.0000 mg | ORAL_TABLET | Freq: Two times a day (BID) | ORAL | 1 refills | Status: DC

## 2018-01-16 MED ORDER — ONDANSETRON HCL 4 MG PO TABS: 4.0000 mg | ORAL_TABLET | Freq: Three times a day (TID) | ORAL | 0 refills | Status: DC | PRN

## 2018-01-16 MED ORDER — ONDANSETRON 4 MG PO TBDP: 4.0000 mg | ORAL_TABLET | Freq: Once | ORAL | Status: DC

## 2018-01-16 MED ORDER — FLUTICASONE PROPIONATE 50 MCG/ACT NA SUSP: 1.0000 | Freq: Every day | NASAL | 0 refills | Status: DC

## 2018-01-16 MED ORDER — ONDANSETRON HCL 4 MG/2ML IJ SOLN
4.0000 mg | Freq: Once | INTRAMUSCULAR | Status: AC
  Administered 2018-01-16: 4 mg via INTRAMUSCULAR

## 2018-01-16 MED ORDER — AMOXICILLIN 500 MG PO CAPS: 500.0000 mg | ORAL_CAPSULE | Freq: Two times a day (BID) | ORAL | 0 refills | Status: DC

## 2018-01-16 NOTE — Patient Instructions (Signed)
Migraine Headache A migraine headache is an intense, throbbing pain on one side or both sides of the head. Migraines may also cause other symptoms, such as nausea, vomiting, and sensitivity to light and noise. What are the causes? Doing or taking certain things may also trigger migraines, such as:  Alcohol.  Smoking.  Medicines, such as: ? Medicine used to treat chest pain (nitroglycerine). ? Birth control pills. ? Estrogen pills. ? Certain blood pressure medicines.  Aged cheeses, chocolate, or caffeine.  Foods or drinks that contain nitrates, glutamate, aspartame, or tyramine.  Physical activity.  Other things that may trigger a migraine include:  Menstruation.  Pregnancy.  Hunger.  Stress, lack of sleep, too much sleep, or fatigue.  Weather changes.  What increases the risk? The following factors may make you more likely to experience migraine headaches:  Age. Risk increases with age.  Family history of migraine headaches.  Being Caucasian.  Depression and anxiety.  Obesity.  Being a woman.  Having a hole in the heart (patent foramen ovale) or other heart problems.  What are the signs or symptoms? The main symptom of this condition is pulsating or throbbing pain. Pain may:  Happen in any area of the head, such as on one side or both sides.  Interfere with daily activities.  Get worse with physical activity.  Get worse with exposure to bright lights or loud noises.  Other symptoms may include:  Nausea.  Vomiting.  Dizziness.  General sensitivity to bright lights, loud noises, or smells.  Before you get a migraine, you may get warning signs that a migraine is developing (aura). An aura may include:  Seeing flashing lights or having blind spots.  Seeing bright spots, halos, or zigzag lines.  Having tunnel vision or blurred vision.  Having numbness or a tingling feeling.  Having trouble talking.  Having muscle weakness.  How is this  diagnosed? A migraine headache can be diagnosed based on:  Your symptoms.  A physical exam.  Tests, such as CT scan or MRI of the head. These imaging tests can help rule out other causes of headaches.  Taking fluid from the spine (lumbar puncture) and analyzing it (cerebrospinal fluid analysis, or CSF analysis).  How is this treated? A migraine headache is usually treated with medicines that:  Relieve pain.  Relieve nausea.  Prevent migraines from coming back.  Treatment may also include:  Acupuncture.  Lifestyle changes like avoiding foods that trigger migraines.  Follow these instructions at home: Medicines  Take over-the-counter and prescription medicines only as told by your health care provider.  Do not drive or use heavy machinery while taking prescription pain medicine.  To prevent or treat constipation while you are taking prescription pain medicine, your health care provider may recommend that you: ? Drink enough fluid to keep your urine clear or pale yellow. ? Take over-the-counter or prescription medicines. ? Eat foods that are high in fiber, such as fresh fruits and vegetables, whole grains, and beans. ? Limit foods that are high in fat and processed sugars, such as fried and sweet foods. Lifestyle  Avoid alcohol use.  Do not use any products that contain nicotine or tobacco, such as cigarettes and e-cigarettes. If you need help quitting, ask your health care provider.  Get at least 8 hours of sleep every night.  Limit your stress. General instructions   Keep a journal to find out what may trigger your migraine headaches. For example, write down: ? What you eat and   drink. ? How much sleep you get. ? Any change to your diet or medicines.  If you have a migraine: ? Avoid things that make your symptoms worse, such as bright lights. ? It may help to lie down in a dark, quiet room. ? Do not drive or use heavy machinery. ? Ask your health care provider  what activities are safe for you while you are experiencing symptoms.  Keep all follow-up visits as told by your health care provider. This is important. Contact a health care provider if:  You develop symptoms that are different or more severe than your usual migraine symptoms. Get help right away if:  Your migraine becomes severe.  You have a fever.  You have a stiff neck.  You have vision loss.  Your muscles feel weak or like you cannot control them.  You start to lose your balance often.  You develop trouble walking.  You faint. This information is not intended to replace advice given to you by your health care provider. Make sure you discuss any questions you have with your health care provider. Document Released: 10/08/2005 Document Revised: 04/27/2016 Document Reviewed: 03/26/2016 Elsevier Interactive Patient Education  2017 Elsevier Inc.  Sinusitis, Adult Sinusitis is soreness and inflammation of your sinuses. Sinuses are hollow spaces in the bones around your face. Your sinuses are located:  Around your eyes.  In the middle of your forehead.  Behind your nose.  In your cheekbones.  Your sinuses and nasal passages are lined with a stringy fluid (mucus). Mucus normally drains out of your sinuses. When your nasal tissues become inflamed or swollen, the mucus can become trapped or blocked so air cannot flow through your sinuses. This allows bacteria, viruses, and funguses to grow, which leads to infection. Sinusitis can develop quickly and last for 7?10 days (acute) or for more than 12 weeks (chronic). Sinusitis often develops after a cold. What are the causes? This condition is caused by anything that creates swelling in the sinuses or stops mucus from draining, including:  Allergies.  Asthma.  Bacterial or viral infection.  Abnormally shaped bones between the nasal passages.  Nasal growths that contain mucus (nasal polyps).  Narrow sinus  openings.  Pollutants, such as chemicals or irritants in the air.  A foreign object stuck in the nose.  A fungal infection. This is rare.  What increases the risk? The following factors may make you more likely to develop this condition:  Having allergies or asthma.  Having had a recent cold or respiratory tract infection.  Having structural deformities or blockages in your nose or sinuses.  Having a weak immune system.  Doing a lot of swimming or diving.  Overusing nasal sprays.  Smoking.  What are the signs or symptoms? The main symptoms of this condition are pain and a feeling of pressure around the affected sinuses. Other symptoms include:  Upper toothache.  Earache.  Headache.  Bad breath.  Decreased sense of smell and taste.  A cough that may get worse at night.  Fatigue.  Fever.  Thick drainage from your nose. The drainage is often green and it may contain pus (purulent).  Stuffy nose or congestion.  Postnasal drip. This is when extra mucus collects in the throat or back of the nose.  Swelling and warmth over the affected sinuses.  Sore throat.  Sensitivity to light.  How is this diagnosed? This condition is diagnosed based on symptoms, a medical history, and a physical exam. To find out  if your condition is acute or chronic, your health care provider may:  Look in your nose for signs of nasal polyps.  Tap over the affected sinus to check for signs of infection.  View the inside of your sinuses using an imaging device that has a light attached (endoscope).  If your health care provider suspects that you have chronic sinusitis, you may also:  Be tested for allergies.  Have a sample of mucus taken from your nose (nasal culture) and checked for bacteria.  Have a mucus sample examined to see if your sinusitis is related to an allergy.  If your sinusitis does not respond to treatment and it lasts longer than 8 weeks, you may have an MRI or CT  scan to check your sinuses. These scans also help to determine how severe your infection is. In rare cases, a bone biopsy may be done to rule out more serious types of fungal sinus disease. How is this treated? Treatment for sinusitis depends on the cause and whether your condition is chronic or acute. If a virus is causing your sinusitis, your symptoms will go away on their own within 10 days. You may be given medicines to relieve your symptoms, including:  Topical nasal decongestants. They shrink swollen nasal passages and let mucus drain from your sinuses.  Antihistamines. These drugs block inflammation that is triggered by allergies. This can help to ease swelling in your nose and sinuses.  Topical nasal corticosteroids. These are nasal sprays that ease inflammation and swelling in your nose and sinuses.  Nasal saline washes. These rinses can help to get rid of thick mucus in your nose.  If your condition is caused by bacteria, you will be given an antibiotic medicine. If your condition is caused by a fungus, you will be given an antifungal medicine. Surgery may be needed to correct underlying conditions, such as narrow nasal passages. Surgery may also be needed to remove polyps. Follow these instructions at home: Medicines  Take, use, or apply over-the-counter and prescription medicines only as told by your health care provider. These may include nasal sprays.  If you were prescribed an antibiotic medicine, take it as told by your health care provider. Do not stop taking the antibiotic even if you start to feel better. Hydrate and Humidify  Drink enough water to keep your urine clear or pale yellow. Staying hydrated will help to thin your mucus.  Use a cool mist humidifier to keep the humidity level in your home above 50%.  Inhale steam for 10-15 minutes, 3-4 times a day or as told by your health care provider. You can do this in the bathroom while a hot shower is running.  Limit  your exposure to cool or dry air. Rest  Rest as much as possible.  Sleep with your head raised (elevated).  Make sure to get enough sleep each night. General instructions  Apply a warm, moist washcloth to your face 3-4 times a day or as told by your health care provider. This will help with discomfort.  Wash your hands often with soap and water to reduce your exposure to viruses and other germs. If soap and water are not available, use hand sanitizer.  Do not smoke. Avoid being around people who are smoking (secondhand smoke).  Keep all follow-up visits as told by your health care provider. This is important. Contact a health care provider if:  You have a fever.  Your symptoms get worse.  Your symptoms do not improve  within 10 days. Get help right away if:  You have a severe headache.  You have persistent vomiting.  You have pain or swelling around your face or eyes.  You have vision problems.  You develop confusion.  Your neck is stiff.  You have trouble breathing. This information is not intended to replace advice given to you by your health care provider. Make sure you discuss any questions you have with your health care provider. Document Released: 10/08/2005 Document Revised: 06/03/2016 Document Reviewed: 08/03/2015 Elsevier Interactive Patient Education  [employer]2018 Elsevier Inc.

## 2018-01-16 NOTE — Progress Notes (Signed)
Subjective:    Patient ID: Elizabeth Washington, female    DOB: 10/20/1986, 32 y.o.   MRN: [id number]  Chief Complaint  Patient presents with  . Migraine  . Sinusitis    HPI Patient was seen today for headaches.  Pt was seen in the ER on Tuesday for continuing migraine. Pt given Toradol IM, Reglan, Benadryl and d/c'd home after headache improved.  Pt states the headaches feel like her typical migraines a tight band around her head with some nausea/vomiting and photophobia.  Pt has taken oxycodone, Tylenol extra strength, and ibuprofen with no relief.  Pt drinks water throughout the day, she has 1 cup of coffee in the am, does not drink tea or sodas, and gets enough rest each night.  In the past pt has been on Topamax but it was d/c'd when she decided to have children.  Pt thinks she may have headache 3 days out of the week.  Current HA is a 3-4/10 pain with some nausea.  Pt also endorses sinus congestion x 1 month.  Patient states she has had a productive cough with sputum recently turned green.  Pt denies facial pressure/tooth pressure.    Past Medical History:  Diagnosis Date  . Kidney stone   . Migraine   . Miscarriage, threatened, early pregnancy 11/28/2014  . Normal intrauterine pregnancy in third trimester 02/10/2016  . SVD (spontaneous vaginal delivery) 02/11/2016  . SVD (spontaneous vaginal delivery) 07/14/2017    No Known Allergies  ROS General: Denies fever, chills, night sweats, changes in weight, changes in appetite HEENT: Denies headaches, ear pain, changes in vision, rhinorrhea, sore throat  + sinus congestion, migraine headache CV: Denies CP, palpitations, SOB, orthopnea Pulm: Denies SOB, wheezing  +cough GI: Denies abdominal pain, nausea, vomiting, diarrhea, constipation GU: Denies dysuria, hematuria, frequency, vaginal discharge Msk: Denies muscle cramps, joint pains Neuro: Denies weakness, numbness, tingling Skin: Denies rashes, bruising Psych: Denies depression,  anxiety, hallucinations     Objective:    Blood pressure 118/70, pulse 83, temperature 98.3 F (36.8 C), temperature source Oral, height 5\' 8"  (1.727 m), weight 177 lb (80.3 kg), last menstrual period 01/03/2018, SpO2 98 %, not currently breastfeeding.   Gen. Pleasant, well-nourished, in no distress, normal affect   HEENT: [place]/AT, face symmetric,no scleral icterus, PERRLA, EOMI, nares patent without drainage, pharynx without erythema, postnasal drainage, no exudate.  TMs full bilaterally. no cervical lymphadenopathy. Lungs: no accessory muscle use, CTAB, no wheezes or rales Cardiovascular: RRR, no m/r/g, no peripheral edema Abdomen: BS present, soft, NT/ND Neuro:  A&Ox3, CN II-XII intact, normal gait   Wt Readings from Last 3 Encounters:  01/16/18 177 lb (80.3 kg)  01/14/18 170 lb (77.1 kg)  07/14/17 208 lb (94.3 kg)    Lab Results  Component Value Date   WBC 9.2 07/15/2017   HGB 9.8 (L) 07/15/2017   HCT 29.5 (L) 07/15/2017   PLT 198 07/15/2017   GLUCOSE 83 07/08/2015   ALT 18 07/08/2015   AST 17 07/08/2015   NA 135 07/08/2015   K 4.3 07/08/2015   CL 105 07/08/2015   CREATININE 0.67 07/08/2015   BUN 12 07/08/2015   CO2 23 07/08/2015    Assessment/Plan:  Migraine without aura and without status migrainosus, not intractable -Given Zofran and Toradol in clinic.   -Rx given for Topamax -Patient encouraged to increase p.o. intake of water, get plenty of rest, limit caffeine daily. - Plan: topiramate (TOPAMAX) 50 MG tablet, ketorolac (TORADOL) injection 60 mg, ondansetron (ZOFRAN)  4 MG tablet, ondansetron (ZOFRAN) injection 4 mg, DISCONTINUED: ondansetron (ZOFRAN-ODT) disintegrating tablet 4 mg  Subacute maxillary sinusitis -Given handout - Plan: fluticasone (FLONASE) 50 MCG/ACT nasal spray, amoxicillin (AMOXIL) 500 MG capsule  Follow-up with PCP as needed  Abbe AmsterdamShannon Jaquasia Doscher, MD

## 2018-01-17 ENCOUNTER — Encounter: Payer: Self-pay | Admitting: Family Medicine

## 2018-02-11 ENCOUNTER — Other Ambulatory Visit: Payer: Self-pay | Admitting: Family Medicine

## 2018-02-11 DIAGNOSIS — G43009 Migraine without aura, not intractable, without status migrainosus: Secondary | ICD-10-CM

## 2018-02-14 NOTE — Telephone Encounter (Signed)
OK to refill medication 

## 2018-03-14 ENCOUNTER — Ambulatory Visit (INDEPENDENT_AMBULATORY_CARE_PROVIDER_SITE_OTHER): Payer: PRIVATE HEALTH INSURANCE | Admitting: Family Medicine

## 2018-03-14 ENCOUNTER — Encounter: Payer: Self-pay | Admitting: Family Medicine

## 2018-03-14 VITALS — BP 102/70 | HR 76 | Temp 98.4°F | Wt 174.8 lb

## 2018-03-14 DIAGNOSIS — G43009 Migraine without aura, not intractable, without status migrainosus: Secondary | ICD-10-CM

## 2018-03-14 MED ORDER — SUMATRIPTAN SUCCINATE 100 MG PO TABS: 100.0000 mg | ORAL_TABLET | ORAL | 1 refills | Status: DC | PRN

## 2018-03-14 NOTE — Progress Notes (Signed)
  Subjective:     Patient ID: Elizabeth Washington, female   DOB: 1986-03-12, 32 y.o.   MRN: [id number]  HPI Patient here to discuss migraine headaches. She had severe migraine headache back in March which prompted visit to ER. She returned here 2 days later with some persistent headache. She was started back on Topamax that point and has not had any severe headaches since then. Her migraine headaches had been occurring more occipital location recently. These were typical of previous migraines otherwise with throbbing quality, nausea, worsening with activity. She thinks there maybe hormonal trigger. She is currently on birth control. She is on Topamax 50 mg twice a day that seems to be working well. No history of aura preceding her migraines  She would like to explore possible acute treatment for migraine. She is not aware of taking triptan medications previously.  Past Medical History:  Diagnosis Date  . Kidney stone   . Migraine   . Miscarriage, threatened, early pregnancy 11/28/2014  . Normal intrauterine pregnancy in third trimester 02/10/2016  . SVD (spontaneous vaginal delivery) 02/11/2016  . SVD (spontaneous vaginal delivery) 07/14/2017   Past Surgical History:  Procedure Laterality Date  . KNEE SURGERY  2008, 2009, 2010   x3    reports that she has never smoked. She has never used smokeless tobacco. She reports that she drinks alcohol. She reports that she does not use drugs. family history includes Arthritis in her mother; Cancer in her unknown relative; Heart disease in her father; Hyperlipidemia in her father. No Known Allergies   Review of Systems  Constitutional: Negative for chills and fever.  Eyes: Negative for visual disturbance.  Respiratory: Negative for shortness of breath.   Cardiovascular: Negative for chest pain.  Neurological: Positive for headaches.       Objective:   Physical Exam  Constitutional: She appears well-developed and well-nourished.  Neck: Neck  supple.  Cardiovascular: Normal rate and regular rhythm.  Pulmonary/Chest: Effort normal and breath sounds normal.  Lymphadenopathy:    She has no cervical adenopathy.  Neurological: She is alert. No cranial nerve deficit.       Assessment:     Migraine headache without aura improved on Topamax    Plan:     -We again stressed importance of continuous birth control while taking Topamax -Thus far she is getting good prophylaxis with Topamax. If she has any more frequent breakthrough consider other preventative medications. -We discussed acute treatment for breakthrough headache with Imitrex 100 mg one at onset of migraine and may repeat one in 2 hours but more than 2 in 24 hours  Kristian Covey MD [facility] at [facility]

## 2018-03-14 NOTE — Patient Instructions (Signed)
Migraine Headache A migraine headache is an intense, throbbing pain on one side or both sides of the head. Migraines may also cause other symptoms, such as nausea, vomiting, and sensitivity to light and noise. What are the causes? Doing or taking certain things may also trigger migraines, such as:  Alcohol.  Smoking.  Medicines, such as: ? Medicine used to treat chest pain (nitroglycerine). ? Birth control pills. ? Estrogen pills. ? Certain blood pressure medicines.  Aged cheeses, chocolate, or caffeine.  Foods or drinks that contain nitrates, glutamate, aspartame, or tyramine.  Physical activity.  Other things that may trigger a migraine include:  Menstruation.  Pregnancy.  Hunger.  Stress, lack of sleep, too much sleep, or fatigue.  Weather changes.  What increases the risk? The following factors may make you more likely to experience migraine headaches:  Age. Risk increases with age.  Family history of migraine headaches.  Being Caucasian.  Depression and anxiety.  Obesity.  Being a woman.  Having a hole in the heart (patent foramen ovale) or other heart problems.  What are the signs or symptoms? The main symptom of this condition is pulsating or throbbing pain. Pain may:  Happen in any area of the head, such as on one side or both sides.  Interfere with daily activities.  Get worse with physical activity.  Get worse with exposure to bright lights or loud noises.  Other symptoms may include:  Nausea.  Vomiting.  Dizziness.  General sensitivity to bright lights, loud noises, or smells.  Before you get a migraine, you may get warning signs that a migraine is developing (aura). An aura may include:  Seeing flashing lights or having blind spots.  Seeing bright spots, halos, or zigzag lines.  Having tunnel vision or blurred vision.  Having numbness or a tingling feeling.  Having trouble talking.  Having muscle weakness.  How is this  diagnosed? A migraine headache can be diagnosed based on:  Your symptoms.  A physical exam.  Tests, such as CT scan or MRI of the head. These imaging tests can help rule out other causes of headaches.  Taking fluid from the spine (lumbar puncture) and analyzing it (cerebrospinal fluid analysis, or CSF analysis).  How is this treated? A migraine headache is usually treated with medicines that:  Relieve pain.  Relieve nausea.  Prevent migraines from coming back.  Treatment may also include:  Acupuncture.  Lifestyle changes like avoiding foods that trigger migraines.  Follow these instructions at home: Medicines  Take over-the-counter and prescription medicines only as told by your health care provider.  Do not drive or use heavy machinery while taking prescription pain medicine.  To prevent or treat constipation while you are taking prescription pain medicine, your health care provider may recommend that you: ? Drink enough fluid to keep your urine clear or pale yellow. ? Take over-the-counter or prescription medicines. ? Eat foods that are high in fiber, such as fresh fruits and vegetables, whole grains, and beans. ? Limit foods that are high in fat and processed sugars, such as fried and sweet foods. Lifestyle  Avoid alcohol use.  Do not use any products that contain nicotine or tobacco, such as cigarettes and e-cigarettes. If you need help quitting, ask your health care provider.  Get at least 8 hours of sleep every night.  Limit your stress. General instructions   Keep a journal to find out what may trigger your migraine headaches. For example, write down: ? What you eat and   drink. ? How much sleep you get. ? Any change to your diet or medicines.  If you have a migraine: ? Avoid things that make your symptoms worse, such as bright lights. ? It may help to lie down in a dark, quiet room. ? Do not drive or use heavy machinery. ? Ask your health care provider  what activities are safe for you while you are experiencing symptoms.  Keep all follow-up visits as told by your health care provider. This is important. Contact a health care provider if:  You develop symptoms that are different or more severe than your usual migraine symptoms. Get help right away if:  Your migraine becomes severe.  You have a fever.  You have a stiff neck.  You have vision loss.  Your muscles feel weak or like you cannot control them.  You start to lose your balance often.  You develop trouble walking.  You faint. This information is not intended to replace advice given to you by your health care provider. Make sure you discuss any questions you have with your health care provider. Document Released: 10/08/2005 Document Revised: 04/27/2016 Document Reviewed: 03/26/2016 Elsevier Interactive Patient Education  2017 Elsevier Inc.   

## 2018-03-19 ENCOUNTER — Other Ambulatory Visit: Payer: Self-pay | Admitting: Family Medicine

## 2018-03-19 DIAGNOSIS — J0100 Acute maxillary sinusitis, unspecified: Secondary | ICD-10-CM

## 2018-03-21 ENCOUNTER — Telehealth: Payer: Self-pay | Admitting: Family Medicine

## 2018-03-21 DIAGNOSIS — G43009 Migraine without aura, not intractable, without status migrainosus: Secondary | ICD-10-CM

## 2018-03-21 MED ORDER — TOPIRAMATE 50 MG PO TABS: ORAL_TABLET | ORAL | 1 refills | Status: DC

## 2018-03-21 NOTE — Telephone Encounter (Signed)
Refill for 6 months. 

## 2018-03-21 NOTE — Telephone Encounter (Signed)
Copied from CRM [phone]. Topic: Quick Communication - See Telephone Encounter >> Mar 21, 2018 11:53 AM Lorrine KinMcGee, Omair Dettmer B, NT wrote: CRM for notification. See Telephone encounter for: 03/21/18. Patient calling and states that the top[facility]TOPAMAX) 50 MG [facility]aryl Never[facility]te told her. Please advise. CB#: [phone]

## 2018-03-21 NOTE — Telephone Encounter (Signed)
Rx sent 

## 2018-03-21 NOTE — Telephone Encounter (Signed)
Okay to refill? 

## 2018-05-15 ENCOUNTER — Other Ambulatory Visit: Payer: Self-pay | Admitting: Family Medicine

## 2018-05-15 DIAGNOSIS — J0100 Acute maxillary sinusitis, unspecified: Secondary | ICD-10-CM

## 2018-05-20 ENCOUNTER — Other Ambulatory Visit: Payer: Self-pay | Admitting: *Deleted

## 2018-05-20 MED ORDER — SUMATRIPTAN SUCCINATE 100 MG PO TABS: 100.0000 mg | ORAL_TABLET | ORAL | 1 refills | Status: DC | PRN

## 2018-07-01 ENCOUNTER — Telehealth: Payer: Self-pay | Admitting: Family Medicine

## 2018-07-01 MED ORDER — SUMATRIPTAN SUCCINATE 100 MG PO TABS: 100.0000 mg | ORAL_TABLET | ORAL | 1 refills | Status: DC | PRN

## 2018-07-01 NOTE — Telephone Encounter (Signed)
Copied from CRM [id number]. Topic: Quick Communication - Rx Refill/Question >> Jul 01, 2018  8:48 AM Baldo Daub L wrote: Medication: SUMAtriptan (IMITREX) 100 MG tablet  Has the patient contacted their pharmacy? Yes - states no refills left (Agent: If no, request that the patient contact the pharmacy for the refill.) (Agent: If yes, when and what did the pharmacy advise?)  Preferred Pharmacy (with phone number or street name): [id number] - [place], [place] - [address] AT [facility] [address] [phone] (Phone) [phone] (Fax)  Agent: Please be advised that RX refills may take up to 3 business days. We ask that you follow-up with your pharmacy.

## 2018-07-01 NOTE — Telephone Encounter (Signed)
Imitrexrefill Last Refill:05/20/18 # 9 1 refill Last OV: 02/22/18 PCP: Burchette Pharmacy:[facility]

## 2018-09-02 ENCOUNTER — Other Ambulatory Visit: Payer: Self-pay | Admitting: Family Medicine

## 2018-09-08 ENCOUNTER — Ambulatory Visit: Payer: Self-pay

## 2018-09-08 ENCOUNTER — Other Ambulatory Visit: Payer: Self-pay

## 2018-09-08 ENCOUNTER — Other Ambulatory Visit: Payer: Self-pay | Admitting: Family Medicine

## 2018-09-08 DIAGNOSIS — G43009 Migraine without aura, not intractable, without status migrainosus: Secondary | ICD-10-CM

## 2018-09-08 MED ORDER — TOPIRAMATE 50 MG PO TABS
ORAL_TABLET | ORAL | 0 refills | Status: DC
Start: 2018-09-08 — End: 2018-09-08

## 2018-09-08 NOTE — Telephone Encounter (Signed)
Please advise 

## 2018-09-08 NOTE — Telephone Encounter (Signed)
Called patient and gave her instructions per Dr. Caryl NeverBurchette. Patient verbalized an understanding. Sent in 30 pill so she would have enough to taper down until done.

## 2018-09-08 NOTE — Telephone Encounter (Signed)
Pt. Is requesting to stop her Topamax. States she tapered off of it before, and would Dr. Caryl NeverBurchette to give her a regimen for this. Please advise pt.

## 2018-09-08 NOTE — Telephone Encounter (Signed)
Would decrease by 25 mg per week until off.

## 2018-10-05 ENCOUNTER — Other Ambulatory Visit: Payer: Self-pay | Admitting: Family Medicine

## 2018-11-12 ENCOUNTER — Other Ambulatory Visit: Payer: Self-pay | Admitting: Family Medicine

## 2018-11-12 MED ORDER — SUMATRIPTAN SUCCINATE 100 MG PO TABS: ORAL_TABLET | ORAL | 3 refills | Status: DC

## 2018-11-12 NOTE — Telephone Encounter (Signed)
Copied from CRM [id number]. Topic: Quick Communication - Rx Refill/Question >> Nov 12, 2018  8:08 AM Crist Infante wrote: Medication: SUMAtriptan (IMITREX) 100 MG tablet  Pt states she has to call every time she needs this med  [id number] [place] Otto, [place] - [address] AT [facility] OF [facility] [address] [phone] (Phone) [phone] (Fax)

## 2019-06-22 ENCOUNTER — Telehealth: Payer: Self-pay | Admitting: Family Medicine

## 2019-06-22 NOTE — Telephone Encounter (Signed)
Requested medication (s) are due for refill today: yes  Requested medication (s) are on the active medication list: yes   Last refill: 11/12/2018 Future visit scheduled: no   Notes to clinic: review for refill   Requested Prescriptions  Pending Prescriptions Disp Refills   SUMAtriptan (IMITREX) 100 MG tablet 9 tablet 3    Sig: TAKE 1 TABLET BY MOUTH AT ONSET OF MIGRAINE, MAY REPEAT IN 2 HOURS IF NEEDED. DO NOT EXCEED TWO DOSES PER 24 HOURS     Neurology:  Migraine Therapy - Triptan Failed - 06/22/2019  2:21 PM      Failed - Valid encounter within last 12 months    Recent Outpatient Visits          1 year ago Migraine without aura and without status migrainosus, not intractable   [facility] at [facility], Alinda Sierras, MD   1 year ago Migraine without aura and without status migrainosus, not intractable   Therapist, music at [place], MD   4 years ago Sinusitis, unspecified chronicity, unspecified location   [employer] [facility], [employer], DO   5 years ago [place] at [employer], Alinda Sierras, MD   5 years ago Frontal headache   Therapist, music at [place], MD             Passed - Last BP in normal range    BP Readings from Last 1 Encounters:  03/14/18 102/70

## 2019-06-22 NOTE — Telephone Encounter (Signed)
See request °

## 2019-06-22 NOTE — Telephone Encounter (Signed)
Medication refill: SUMAtriptan (IMITREX) 100 MG tablet [[id number]]   Pharmacy:  [facility] #[id number] [place], [place] - [place] [phone] (Phone) [phone] (Fax)

## 2019-06-26 NOTE — Telephone Encounter (Signed)
Pt called in to follow up on refill request. Pt says that she would like to pick up her medication today or tomorrow if possible.    Please assist.

## 2019-06-30 ENCOUNTER — Other Ambulatory Visit: Payer: Self-pay

## 2019-06-30 ENCOUNTER — Telehealth (INDEPENDENT_AMBULATORY_CARE_PROVIDER_SITE_OTHER): Payer: 59 | Admitting: Family Medicine

## 2019-06-30 DIAGNOSIS — G43009 Migraine without aura, not intractable, without status migrainosus: Secondary | ICD-10-CM

## 2019-06-30 MED ORDER — SUMATRIPTAN SUCCINATE 100 MG PO TABS: ORAL_TABLET | ORAL | 11 refills | Status: DC

## 2019-06-30 NOTE — Telephone Encounter (Signed)
Patient has a Doxy set up today at 3:45pm for med refill since it has been over a year since her last visit. Please send after visit. Thanks!

## 2019-06-30 NOTE — Progress Notes (Signed)
This visit type was conducted due to national recommendations for restrictions regarding the COVID-19 pandemic in an effort to limit this patient's exposure and mitigate transmission in our community.   Virtual Visit via Video Note  I connected with Elizabeth Washington on 06/30/19 at  3:45 PM EDT by a video enabled telemedicine application and verified that I am speaking with the correct person using two identifiers.  Location patient: home Location provider:work or home office Persons participating in the virtual visit: patient, provider  I discussed the limitations of evaluation and management by telemedicine and the availability of in person appointments. The patient expressed understanding and agreed to proceed.   HPI: Patient has history of migraine headaches without aura.  She is calling for refills of Imitrex.  This generally works very well for migraines if she uses this in time.  Rarely has to take a second Imitrex.  Her migraines tend to be related to her menstrual cycles.  Usually has about 1/month.  She has recently made some dietary changes in hopes that will help as well.  Sometimes gets relief with over-the-counter medications but frequently requires Imitrex.   ROS: See pertinent positives and negatives per HPI.  Past Medical History:  Diagnosis Date  . Kidney stone   . Migraine   . Miscarriage, threatened, early pregnancy 11/28/2014  . Normal intrauterine pregnancy in third trimester 02/10/2016  . SVD (spontaneous vaginal delivery) 02/11/2016  . SVD (spontaneous vaginal delivery) 07/14/2017    Past Surgical History:  Procedure Laterality Date  . KNEE SURGERY  2008, 2009, 2010   x3    Family History  Problem Relation Age of Onset  . Arthritis Mother   . Hyperlipidemia Father   . Heart disease Father   . Cancer Unknown        prostate/grandfather    SOCIAL HX: She is married and has 2 children ages 2 and 873.  Non-smoker.   Current Outpatient Medications:  .   acetaminophen (TYLENOL) 500 MG tablet, Take 1,000 mg by mouth every 6 (six) hours as needed for mild pain, moderate pain, fever or headache., Disp: , Rfl:  .  fluticasone (FLONASE) 50 MCG/ACT nasal spray, SHAKE LIQUID AND USE 1 SPRAY IN EACH NOSTRIL DAILY, Disp: 48 g, Rfl: 0 .  ibuprofen (ADVIL,MOTRIN) 800 MG tablet, Take 1 tablet (800 mg total) by mouth every 8 (eight) hours as needed., Disp: 45 tablet, Rfl: 1 .  ondansetron (ZOFRAN) 4 MG tablet, Take 1 tablet (4 mg total) by mouth every 8 (eight) hours as needed for nausea or vomiting., Disp: 20 tablet, Rfl: 0 .  SUMAtriptan (IMITREX) 100 MG tablet, TAKE 1 TABLET BY MOUTH AT ONSET OF MIGRAINE, MAY REPEAT IN 2 HOURS IF NEEDED. DO NOT EXCEED TWO DOSES PER 24 HOURS, Disp: 9 tablet, Rfl: 11  EXAM:  VITALS per patient if applicable:  GENERAL: alert, oriented, appears well and in no acute distress  HEENT: atraumatic, conjunttiva clear, no obvious abnormalities on inspection of external nose and ears  NECK: normal movements of the head and neck  LUNGS: on inspection no signs of respiratory distress, breathing rate appears normal, no obvious gross SOB, gasping or wheezing  CV: no obvious cyanosis  MS: moves all visible extremities without noticeable abnormality  PSYCH/NEURO: pleasant and cooperative, no obvious depression or anxiety, speech and thought processing grossly intact  ASSESSMENT AND PLAN:  Discussed the following assessment and plan:  Migraine without aura and without status migrainosus, not intractable  -Refill Imitrex for 1 year -  She is not requiring prophylactic medications at this point given infrequent headaches     I discussed the assessment and treatment plan with the patient. The patient was provided an opportunity to ask questions and all were answered. The patient agreed with the plan and demonstrated an understanding of the instructions.   The patient was advised to call back or seek an in-person evaluation if the  symptoms worsen or if the condition fails to improve as anticipated.   Carolann Littler, MD

## 2019-12-18 ENCOUNTER — Ambulatory Visit (INDEPENDENT_AMBULATORY_CARE_PROVIDER_SITE_OTHER): Payer: Self-pay | Admitting: Plastic Surgery

## 2019-12-18 ENCOUNTER — Encounter: Payer: Self-pay | Admitting: Plastic Surgery

## 2019-12-18 ENCOUNTER — Other Ambulatory Visit: Payer: Self-pay

## 2019-12-18 DIAGNOSIS — Z719 Counseling, unspecified: Secondary | ICD-10-CM | POA: Insufficient documentation

## 2019-12-18 NOTE — Progress Notes (Signed)
Botulinum Toxin Procedure Note  Procedure: Cosmetic botulinum toxin  Pre-operative Diagnosis: Dynamic rhytides   Post-operative Diagnosis: Same  Complications:  None  Brief history: The patient desires botulinum toxin injection of her forehead. I discussed with the patient this proposed procedure of botulinum toxin injections, which is customized depending on the particular needs of the patient. It is performed on facial rhytids as a temporary correction. The alternatives were discussed with the patient. The risks were addressed including bleeding, scarring, infection, damage to deeper structures, asymmetry, and chronic pain, which may occur infrequently after a procedure. The individual's choice to undergo a surgical procedure is based on the comparison of risks to potential benefits. Other risks include unsatisfactory results, brow ptosis, eyelid ptosis, allergic reaction, temporary paralysis, which should go away with time, bruising, blurring disturbances and delayed healing. Botulinum toxin injections do not arrest the aging process or produce permanent tightening of the eyelid.  Operative intervention maybe necessary to maintain the results of a blepharoplasty or botulinum toxin. The patient understands and wishes to proceed.  Procedure: The area was prepped with alcohol and dried with a clean gauze. Using a clean technique, the botulinum toxin was diluted with 1.25 cc of preservative-free normal saline which was slowly injected with an 18 gauge needle in a tuberculin syringes.  A 32 gauge needles were then used to inject the botulinum toxin. This mixture allow for an aliquot of 5 units per 0.1 cc in each injection site.    Subsequently the mixture was injected in the glabellar and forehead area with preservation of the temporal branch to the lateral eyebrow as well as into each lateral canthal area beginning from the lateral orbital rim medial to the zygomaticus major in 3 separate areas. A total  of 30 Units of botulinum toxin was used. The forehead and glabellar area was injected with care to inject intramuscular only while holding pressure on the supratrochlear vessels in each area during each injection on either side of the medial corrugators. The injection proceeded vertically superiorly to the medial 2/3 of the frontalis muscle and superior 2/3 of the lateral frontalis, again with preservation of the frontal branch.  The midface area was injected at the 3 sub-regions of the mid-face: zygomaticomalar region, anteromedial cheek region, and submalar region for a total of one syringe on each side of the face. The technique used was serial puncture with equal injections in the 3 sub-regions: the zygomaticomalar region, the anteromedial cheek, and the submalar region.  No complications were noted. Light pressure was held for 5 minutes. She was instructed explicitly in post-operative care.  Botox LOT:  [id number] EXP:  9/23

## 2019-12-27 ENCOUNTER — Ambulatory Visit: Payer: PRIVATE HEALTH INSURANCE | Attending: Internal Medicine

## 2019-12-27 DIAGNOSIS — Z23 Encounter for immunization: Secondary | ICD-10-CM

## 2019-12-27 NOTE — Progress Notes (Signed)
   Covid-19 Vaccination Clinic  Name:  Elizabeth Washington    MRN: [id number] DOB: May 20, 1986  12/27/2019  [name] was observed post Covid-19 immunization for 15 minutes without incident. She was provided with Vaccine Information Sheet and instruction to access the V-Safe system.   [name] was instructed to call 911 with any severe reactions post vaccine: Marland Kitchen Difficulty breathing  . Swelling of face and throat  . A fast heartbeat  . A bad rash all over body  . Dizziness and weakness   Immunizations Administered    Name Date Dose VIS Date Route   Pfizer COVID-19 Vaccine 12/27/2019 11:06 AM 0.3 mL 10/02/2019 Intramuscular   Manufacturer: [employer], [employer]   Lot: [id number]   NDC: [address]-3

## 2020-01-27 ENCOUNTER — Ambulatory Visit: Payer: PRIVATE HEALTH INSURANCE | Attending: Internal Medicine

## 2020-01-27 DIAGNOSIS — Z23 Encounter for immunization: Secondary | ICD-10-CM

## 2020-01-27 NOTE — Progress Notes (Signed)
   Covid-19 Vaccination Clinic  Name:  Elizabeth Washington    MRN: [id number] DOB: 1985/12/20  01/27/2020  [name] was observed post Covid-19 immunization for 15 minutes without incident. She was provided with Vaccine Information Sheet and instruction to access the V-Safe system.   [name] was instructed to call 911 with any severe reactions post vaccine: Marland Kitchen Difficulty breathing  . Swelling of face and throat  . A fast heartbeat  . A bad rash all over body  . Dizziness and weakness   Immunizations Administered    Name Date Dose VIS Date Route   Pfizer COVID-19 Vaccine 01/27/2020  8:47 AM 0.3 mL 10/02/2019 Intramuscular   Manufacturer: [employer], [employer]   Lot: [id number]   NDC: [address]-0

## 2020-07-10 ENCOUNTER — Other Ambulatory Visit: Payer: Self-pay | Admitting: Family Medicine

## 2020-10-25 ENCOUNTER — Ambulatory Visit (INDEPENDENT_AMBULATORY_CARE_PROVIDER_SITE_OTHER): Payer: Self-pay | Admitting: Plastic Surgery

## 2020-10-25 ENCOUNTER — Other Ambulatory Visit: Payer: Self-pay

## 2020-10-25 ENCOUNTER — Encounter: Payer: Self-pay | Admitting: Plastic Surgery

## 2020-10-25 DIAGNOSIS — Z719 Counseling, unspecified: Secondary | ICD-10-CM

## 2020-10-25 NOTE — Progress Notes (Signed)
Botulinum Toxin Procedure Note  Procedure: Cosmetic botulinum toxin  Pre-operative Diagnosis: Dynamic rhytides  Post-operative Diagnosis: Same  Complications:  None  Brief history: The patient desires botulinum toxin injection of her forehead. I discussed with the patient this proposed procedure of botulinum toxin injections, which is customized depending on the particular needs of the patient. It is performed on facial rhytids as a temporary correction. The alternatives were discussed with the patient. The risks were addressed including bleeding, scarring, infection, damage to deeper structures, asymmetry, and chronic pain, which may occur infrequently after a procedure. The individual's choice to undergo a surgical procedure is based on the comparison of risks to potential benefits. Other risks include unsatisfactory results, brow ptosis, eyelid ptosis, allergic reaction, temporary paralysis, which should go away with time, bruising, blurring disturbances and delayed healing. Botulinum toxin injections do not arrest the aging process or produce permanent tightening of the eyelid.  Operative intervention maybe necessary to maintain the results of a blepharoplasty or botulinum toxin. The patient understands and wishes to proceed.  Procedure: The area was prepped with alcohol and dried with a clean gauze. Using a clean technique, the botulinum toxin was diluted with 1.25 cc of preservative-free normal saline which was slowly injected with an 18 gauge needle in a tuberculin syringes.  A 32 gauge needles were then used to inject the botulinum toxin. This mixture allow for an aliquot of 5 units per 0.1 cc in each injection site.    Subsequently the mixture was injected in the glabellar and forehead area with preservation of the temporal branch to the lateral eyebrow as well as into each lateral canthal area beginning from the lateral orbital rim medial to the zygomaticus major in 3 separate areas. A total  of 30 Units of botulinum toxin was used. The forehead and glabellar area was injected with care to inject intramuscular only while holding pressure on the supratrochlear vessels in each area during each injection on either side of the medial corrugators. The injection proceeded vertically superiorly to the medial 2/3 of the frontalis muscle and superior 2/3 of the lateral frontalis, again with preservation of the frontal branch.  No complications were noted. Light pressure was held for 5 minutes. She was instructed explicitly in post-operative care.  Botox LOT:  [id number] [id number] EXP:  4/24  

## 2020-11-16 ENCOUNTER — Encounter: Payer: Self-pay | Admitting: Plastic Surgery

## 2021-01-17 ENCOUNTER — Other Ambulatory Visit: Payer: PRIVATE HEALTH INSURANCE | Admitting: Plastic Surgery

## 2021-02-27 ENCOUNTER — Other Ambulatory Visit: Payer: Self-pay | Admitting: Physician Assistant

## 2021-02-27 DIAGNOSIS — M25562 Pain in left knee: Secondary | ICD-10-CM

## 2021-03-06 ENCOUNTER — Other Ambulatory Visit: Payer: Self-pay

## 2021-03-06 ENCOUNTER — Ambulatory Visit: Payer: PRIVATE HEALTH INSURANCE | Admitting: Sports Medicine

## 2021-03-06 ENCOUNTER — Ambulatory Visit (INDEPENDENT_AMBULATORY_CARE_PROVIDER_SITE_OTHER): Payer: 59

## 2021-03-06 DIAGNOSIS — M25562 Pain in left knee: Secondary | ICD-10-CM

## 2021-03-13 ENCOUNTER — Ambulatory Visit (INDEPENDENT_AMBULATORY_CARE_PROVIDER_SITE_OTHER): Payer: Self-pay | Admitting: Plastic Surgery

## 2021-03-13 ENCOUNTER — Other Ambulatory Visit: Payer: Self-pay

## 2021-03-13 ENCOUNTER — Encounter: Payer: Self-pay | Admitting: Plastic Surgery

## 2021-03-13 DIAGNOSIS — Z719 Counseling, unspecified: Secondary | ICD-10-CM

## 2021-03-13 NOTE — Progress Notes (Signed)
[place] Preoperative Dx: Hyperpigmentation and rosacea  Postoperative Dx:  same  Procedure: laser to face   Anesthesia: none  Description of Procedure:  Risks and complications were explained to the patient. Consent was confirmed and signed. Time out was called and all information was confirmed to be correct. The area  area was prepped with alcohol and wiped dry. The BBL laser was set at 6 J/cm2. The face was lasered and then targeted for the hyperpigmentation and a few cherry angiomas. The patient tolerated the procedure well and there were no complications. The patient is to follow up in 4 weeks.

## 2021-04-04 ENCOUNTER — Other Ambulatory Visit: Payer: Self-pay

## 2021-04-04 ENCOUNTER — Ambulatory Visit (INDEPENDENT_AMBULATORY_CARE_PROVIDER_SITE_OTHER): Payer: Self-pay | Admitting: Plastic Surgery

## 2021-04-04 ENCOUNTER — Encounter: Payer: Self-pay | Admitting: Plastic Surgery

## 2021-04-04 DIAGNOSIS — Z719 Counseling, unspecified: Secondary | ICD-10-CM

## 2021-04-04 NOTE — Progress Notes (Signed)
Sciton  Preoperative Dx: hyperpigmentation of face  Postoperative Dx:  same  Procedure: laser to face   Anesthesia: none  Description of Procedure:  Risks and complications were explained to the patient. Consent was confirmed and signed. Time out was called and all information was confirmed to be correct. The area  area was prepped with alcohol and wiped dry. The BBL laser was set at 7 J/cm2. The face was lasered.  The patient had 2 milia on her use the halo microns.  The patient tolerated the procedure well and there were no complications. The patient is to follow up in 4 weeks.

## 2021-05-09 ENCOUNTER — Ambulatory Visit (INDEPENDENT_AMBULATORY_CARE_PROVIDER_SITE_OTHER): Payer: Self-pay | Admitting: Plastic Surgery

## 2021-05-09 ENCOUNTER — Encounter: Payer: Self-pay | Admitting: Plastic Surgery

## 2021-05-09 ENCOUNTER — Other Ambulatory Visit: Payer: Self-pay

## 2021-05-09 VITALS — BP 111/74 | HR 60

## 2021-05-09 DIAGNOSIS — Z719 Counseling, unspecified: Secondary | ICD-10-CM

## 2021-05-09 NOTE — Progress Notes (Signed)
Sc[place] Preoperative Dx: Hyperpigmentation of face  Postoperative Dx:  same  Procedure: laser to face  Anesthesia: none  Description of Procedure:  Risks and complications were explained to the patient. Consent was confirmed and signed. Time out was called and all information was confirmed to be correct. The area  area was prepped with alcohol and wiped dry. The BBL laser was set at 6-7 J/cm2. The face was lasered. The patient tolerated the procedure well and there were no complications. The patient is to follow up in 4 weeks. The 515 nm wave length was used and the 560 nm for the angioma.

## 2021-05-16 ENCOUNTER — Telehealth (INDEPENDENT_AMBULATORY_CARE_PROVIDER_SITE_OTHER): Payer: Self-pay | Admitting: Family Medicine

## 2021-05-16 DIAGNOSIS — J0100 Acute maxillary sinusitis, unspecified: Secondary | ICD-10-CM

## 2021-05-16 DIAGNOSIS — G43009 Migraine without aura, not intractable, without status migrainosus: Secondary | ICD-10-CM

## 2021-05-16 MED ORDER — AMOXICILLIN-POT CLAVULANATE 875-125 MG PO TABS: 1.0000 | ORAL_TABLET | Freq: Two times a day (BID) | ORAL | 0 refills | Status: DC

## 2021-05-16 MED ORDER — UBRELVY 50 MG PO TABS: ORAL_TABLET | ORAL | 11 refills | Status: DC

## 2021-05-16 NOTE — Progress Notes (Signed)
Patient ID: Elizabeth Washington, female   DOB: 1986-09-10, 35 y.o.   MRN: [id number]   This visit type was conducted due to national recommendations for restrictions regarding the COVID-19 pandemic in an effort to limit this patient's exposure and mitigate transmission in our community.   Virtual Visit via Video Note  I connected with Elizabeth Washington on 05/16/21 at  3:15 PM EDT by a video enabled telemedicine application and verified that I am speaking with the correct person using two identifiers.  Location patient: home Location provider:work or home office Persons participating in the virtual visit: patient, provider  I discussed the limitations of evaluation and management by telemedicine and the availability of in person appointments. The patient expressed understanding and agreed to proceed.   HPI:  Elizabeth Washington called to discuss the following items  Probable acute sinusitis.  Onset of sore throat about 3 weeks ago.  She has had some progressive sinus congestion and tenderness over her maxillary sinuses past couple weeks.  She has had some increased fatigue.  Some mild body aches.  She had increased pain maxillary sinuses bilaterally.  Occasional headaches.  Had some hoarseness last week.  Has had very typical symptoms in the past with sinusitis.  No recent sinusitis prior to this episode.  Has taken Augmentin in the past and done well.  Only rare cough.  No dyspnea.  She has history of migraine headaches.  Currently takes Ubrelvy 50 mg which seems to be working very well.  No major side effects.  She is requesting refills.  ROS: See pertinent positives and negatives per HPI.  Past Medical History:  Diagnosis Date   Kidney stone    Migraine    Miscarriage, threatened, early pregnancy 11/28/2014   Normal intrauterine pregnancy in third trimester 02/10/2016   SVD (spontaneous vaginal delivery) 02/11/2016   SVD (spontaneous vaginal delivery) 07/14/2017    Past Surgical History:  Procedure  Laterality Date   KNEE SURGERY  2008, 2009, 2010   x3    Family History  Problem Relation Age of Onset   Arthritis Mother    Hyperlipidemia Father    Heart disease Father    Cancer Unknown        prostate/grandfather    SOCIAL HX: Non-smoker   Current Outpatient Medications:    amoxicillin-clavulanate (AUGMENTIN) 875-125 MG tablet, Take 1 tablet by mouth 2 (two) times daily., Disp: 20 tablet, Rfl: 0   Ubrogepant (UBRELVY) 50 MG TABS, Take 1 at onset of migraine headache and may repeat dose once in two hours if necessary, Disp: 10 tablet, Rfl: 11   acetaminophen (TYLENOL) 500 MG tablet, Take 1,000 mg by mouth every 6 (six) hours as needed for mild pain, moderate pain, fever or headache. (Patient not taking: Reported on 05/09/2021), Disp: , Rfl:    ibuprofen (ADVIL,MOTRIN) 800 MG tablet, Take 1 tablet (800 mg total) by mouth every 8 (eight) hours as needed. (Patient not taking: Reported on 05/09/2021), Disp: 45 tablet, Rfl: 1  EXAM:  VITALS per patient if applicable:  GENERAL: alert, oriented, appears well and in no acute distress  HEENT: atraumatic, conjunttiva clear, no obvious abnormalities on inspection of external nose and ears  NECK: normal movements of the head and neck  LUNGS: on inspection no signs of respiratory distress, breathing rate appears normal, no obvious gross SOB, gasping or wheezing  CV: no obvious cyanosis  MS: moves all visible extremities without noticeable abnormality  PSYCH/NEURO: pleasant and cooperative, no obvious depression or anxiety, speech and  thought processing grossly intact  ASSESSMENT AND PLAN:  Discussed the following assessment and plan:   #1 acute sinusitis, non-recurrent -Given duration of symptoms start Augmentin 875 mg twice daily with food -Follow-up for persistent or worsening symptoms  #2 history of migraine headaches without aura -Patient requesting refills of Ubrelvy.  Refill Ubrelvy 50 mg 1 at onset of migraine and may  repeat once in 2 hours as needed    I discussed the assessment and treatment plan with the patient. The patient was provided an opportunity to ask questions and all were answered. The patient agreed with the plan and demonstrated an understanding of the instructions.   The patient was advised to call back or seek an in-person evaluation if the symptoms worsen or if the condition fails to improve as anticipated.     Evelena Peat, MD

## 2021-05-17 ENCOUNTER — Encounter: Payer: Self-pay | Admitting: Family Medicine

## 2021-05-24 NOTE — Telephone Encounter (Signed)
Elizabeth Washington (Key: [id number]) Elizabeth Washington 50MG  tablets

## 2021-06-06 ENCOUNTER — Other Ambulatory Visit: Payer: Self-pay

## 2021-06-06 ENCOUNTER — Encounter: Payer: Self-pay | Admitting: Plastic Surgery

## 2021-06-06 ENCOUNTER — Ambulatory Visit (INDEPENDENT_AMBULATORY_CARE_PROVIDER_SITE_OTHER): Payer: Self-pay | Admitting: Plastic Surgery

## 2021-06-06 DIAGNOSIS — Z719 Counseling, unspecified: Secondary | ICD-10-CM

## 2021-06-06 NOTE — Progress Notes (Signed)
[place] Preoperative Dx: hyperpigmentation of face  Postoperative Dx:  same  Procedure: laser to face   Anesthesia: none  Description of Procedure:  Risks and complications were explained to the patient. Consent was confirmed and signed. Time out was called and all information was confirmed to be correct. The area  area was prepped with alcohol and wiped dry. The BBL laser was set at 7 J/cm2. The face was lasered. The patient tolerated the procedure well and there were no complications. The patient is to follow up in 4 weeks.    

## 2021-06-20 ENCOUNTER — Ambulatory Visit (INDEPENDENT_AMBULATORY_CARE_PROVIDER_SITE_OTHER): Payer: Self-pay | Admitting: Plastic Surgery

## 2021-06-20 ENCOUNTER — Other Ambulatory Visit: Payer: Self-pay

## 2021-06-20 ENCOUNTER — Encounter: Payer: Self-pay | Admitting: Plastic Surgery

## 2021-06-20 DIAGNOSIS — Z719 Counseling, unspecified: Secondary | ICD-10-CM

## 2021-06-20 NOTE — Progress Notes (Signed)

## 2021-10-10 ENCOUNTER — Telehealth (INDEPENDENT_AMBULATORY_CARE_PROVIDER_SITE_OTHER): Payer: Managed Care, Other (non HMO) | Admitting: Family Medicine

## 2021-10-10 ENCOUNTER — Telehealth: Payer: Self-pay | Admitting: Family Medicine

## 2021-10-10 ENCOUNTER — Telehealth: Payer: Managed Care, Other (non HMO) | Admitting: Family Medicine

## 2021-10-10 ENCOUNTER — Other Ambulatory Visit: Payer: Self-pay

## 2021-10-10 DIAGNOSIS — J0100 Acute maxillary sinusitis, unspecified: Secondary | ICD-10-CM

## 2021-10-10 MED ORDER — AMOXICILLIN-POT CLAVULANATE 875-125 MG PO TABS: 1.0000 | ORAL_TABLET | Freq: Two times a day (BID) | ORAL | 0 refills | Status: DC

## 2021-10-10 NOTE — Telephone Encounter (Signed)
Patient called back because she tested for covid after her appointment with an expired covid test and it was positive. Patient wanted to know if that would change anything her and Dr.Burchette spoke about, or what she was prescribed. I asked rachel, as Lillia Abed was unavailable, and Fleet Contras said she needed to test again with a non expired covid test as it could've been a false positive. I relayed information to patient and she asked to come into office to be tested. I told her we were not a testing site and she could not come back to be tested unless the doctor told her to come after the virtual appointment or if she was to do it before a possible in office. Patient asked if flu was included in the covid test and I said not unless it was stated on the box that it test for both and normally you cannot buy those. Patient asked if there was any appointments in office between now and closing and I said no. She asked me to check other offices and I did but there was still nothing. Patient verbalized understanding    Please advise

## 2021-10-10 NOTE — Telephone Encounter (Signed)
Patient calling in with respiratory symptoms: Shortness of breath, chest pain, palpitations or other red words send to Triage  Does the patient have a fever over 100, cough, congestion, sore throat, runny nose, lost of taste/smell (please list symptoms that patient has)? A little cough, aching, sinus pressure   What date did symptoms start?10-07-2021 (If over 5 days ago, pt may be scheduled for in person visit)  Have you tested for Covid in the last 5 days? No   If yes, was it positive []  OR negative [] ? If positive in the last 5 days, please schedule virtual visit now. If negative, schedule for an in person OV with the next available provider if PCP has no openings. Please also let patient know they will be tested again (follow the script below)  "you will have to arrive prior to your appt time to be Covid tested. Please park in back of office at the [facility] & call [phone] to let the staff know you have arrived. A staff member will meet you at your car to do a rapid covid test. Once the test has resulted you will be notified by phone of your results to determine if appt will remain an in person visit or be converted to a virtual/phone visit. If you arrive less than before your appt time, your visit will be automatically converted to virtual & any recommended testing will happen AFTER the visit." Pt has virtual appt with dr [phone] 10-10-2021 130 pm  THINGS TO REMEMBER  If no availability for virtual visit in office,  please schedule another [place] office  If no availability at another [place] office, please instruct patient that they can schedule an evisit or virtual visit through their mychart account. Visits up to 8pm  patients can be seen in office 5 days after positive COVID test

## 2021-10-10 NOTE — Progress Notes (Signed)
Patient ID: Elizabeth Washington, female   DOB: Nov 19, 1985, 35 y.o.   MRN: [id number]   This visit type was conducted due to national recommendations for restrictions regarding the COVID-19 pandemic in an effort to limit this patient's exposure and mitigate transmission in our community.   Virtual Visit via Video Note  I connected with Saira Kramme on 10/10/21 at  1:30 PM EST by a video enabled telemedicine application and verified that I am speaking with the correct person using two identifiers.  Location patient: home Location provider:work or home office Persons participating in the virtual visit: patient, provider  I discussed the limitations of evaluation and management by telemedicine and the availability of in person appointments. The patient expressed understanding and agreed to proceed.   HPI:  Elizabeth Washington called with concerns for acute sinusitis.  [name]  She relates onset about 5 or 6 days ago of some nasal congestion.  She has had some progressive achiness and facial pressure especially right maxillary sinus.  Upper teeth pain.  No fever.  No sore throat.  Symptoms seem to be progressing.  She is concerned because she has had sinusitis that has taken weeks to resolve in the past.  No major cough at this time.   ROS: See pertinent positives and negatives per HPI.  Past Medical History:  Diagnosis Date   Kidney stone    Migraine    Miscarriage, threatened, early pregnancy 11/28/2014   Normal intrauterine pregnancy in third trimester 02/10/2016   SVD (spontaneous vaginal delivery) 02/11/2016   SVD (spontaneous vaginal delivery) 07/14/2017    Past Surgical History:  Procedure Laterality Date   KNEE SURGERY  2008, 2009, 2010   x3    Family History  Problem Relation Age of Onset   Arthritis Mother    Hyperlipidemia Father    Heart disease Father    Cancer Unknown        prostate/grandfather    SOCIAL HX: Non-smoker   Current  Outpatient Medications:    acetaminophen (TYLENOL) 500 MG tablet, Take 1,000 mg by mouth every 6 (six) hours as needed for mild pain, moderate pain, fever or headache., Disp: , Rfl:    ibuprofen (ADVIL,MOTRIN) 800 MG tablet, Take 1 tablet (800 mg total) by mouth every 8 (eight) hours as needed., Disp: 45 tablet, Rfl: 1   Ubrogepant (UBRELVY) 50 MG TABS, Take 1 at onset of migraine headache and may repeat dose once in two hours if necessary, Disp: 10 tablet, Rfl: 11  EXAM:  VITALS per patient if applicable:  GENERAL: alert, oriented, appears well and in no acute distress  HEENT: atraumatic, conjunttiva clear, no obvious abnormalities on inspection of external nose and ears  NECK: normal movements of the head and neck  LUNGS: on inspection no signs of respiratory distress, breathing rate appears normal, no obvious gross SOB, gasping or wheezing  CV: no obvious cyanosis  MS: moves all visible extremities without noticeable abnormality  PSYCH/NEURO: pleasant and cooperative, no obvious depression or anxiety, speech and thought processing grossly intact  ASSESSMENT AND PLAN:  Discussed the following assessment and plan:  Acute sinusitis symptoms.  We discussed the fact that most sinusitis is probably viral.  However, she has pain fairly localized right maxillary sinus and also upper teeth pain which are predictors of possible bacterial infection.  She also has history of difficulty clearing these in the past.  -We elected to go and cover with Augmentin 875 mg twice  daily for 10 days -Follow-up for any persistent or worsening symptoms -Consider over-the-counter Mucinex twice daily     I discussed the assessment and treatment plan with the patient. The patient was provided an opportunity to ask questions and all were answered. The patient agreed with the plan and demonstrated an understanding of the instructions.   The patient was advised to call back or seek an in-person evaluation if  the symptoms worsen or if the condition fails to improve as anticipated.     Evelena Peat, MD

## 2021-10-10 NOTE — Telephone Encounter (Signed)
I spoke with patient.  She ended up going to urgent care and they did confirm she had COVID positive.  They added steroid and did recommend she continue with the Augmentin as she likely has concomitant sinus infection.  They elected not to start antiviral therapy which seems reasonable since she is young and healthy and with relatively mild symptoms

## 2021-12-25 ENCOUNTER — Telehealth: Payer: Self-pay | Admitting: Family Medicine

## 2021-12-25 MED ORDER — ACETAZOLAMIDE 125 MG PO TABS: ORAL_TABLET | ORAL | 0 refills | Status: DC

## 2021-12-25 NOTE — Telephone Encounter (Signed)
Patient called regarding trip to [facility] on Friday... patient is requesting high altitude medication. Since last time she went the high altitude bothered her.  ?

## 2021-12-25 NOTE — Telephone Encounter (Signed)
I sent in acetazolamide 125 mg by mouth twice daily and this will need to be started 1 day prior to ascent ?

## 2021-12-25 NOTE — Telephone Encounter (Signed)
Pt informed that rx has been sent.  

## 2022-03-06 DIAGNOSIS — L292 Pruritus vulvae: Secondary | ICD-10-CM | POA: Diagnosis not present

## 2022-03-06 DIAGNOSIS — N926 Irregular menstruation, unspecified: Secondary | ICD-10-CM | POA: Diagnosis not present

## 2022-03-06 DIAGNOSIS — N899 Noninflammatory disorder of vagina, unspecified: Secondary | ICD-10-CM | POA: Diagnosis not present

## 2022-03-06 DIAGNOSIS — R5383 Other fatigue: Secondary | ICD-10-CM | POA: Diagnosis not present

## 2022-03-13 DIAGNOSIS — R87810 Cervical high risk human papillomavirus (HPV) DNA test positive: Secondary | ICD-10-CM | POA: Diagnosis not present

## 2022-03-13 DIAGNOSIS — R87612 Low grade squamous intraepithelial lesion on cytologic smear of cervix (LGSIL): Secondary | ICD-10-CM | POA: Diagnosis not present

## 2022-03-13 DIAGNOSIS — N926 Irregular menstruation, unspecified: Secondary | ICD-10-CM | POA: Diagnosis not present

## 2022-03-13 DIAGNOSIS — N939 Abnormal uterine and vaginal bleeding, unspecified: Secondary | ICD-10-CM | POA: Diagnosis not present

## 2022-03-13 DIAGNOSIS — Z3202 Encounter for pregnancy test, result negative: Secondary | ICD-10-CM | POA: Diagnosis not present

## 2022-03-25 IMAGING — MR MR KNEE*L* W/O CM
8 of 9 series · 36 of 40 positions shown · non-contrast
Comparison: None.

CLINICAL DATA: Medial knee pain. Pickle ball injury. History of
prior knee surgeries.

EXAM:
MRI OF THE LEFT KNEE WITHOUT CONTRAST
TECHNIQUE: Multiplanar, multisequence MR imaging of the knee was performed. No
intravenous contrast was administered.

[Series 3: T1 · coronal · 4.0mm · 0.50mm/px · 6 of 34 slices shown (1 of 2)]
[im 1/34]
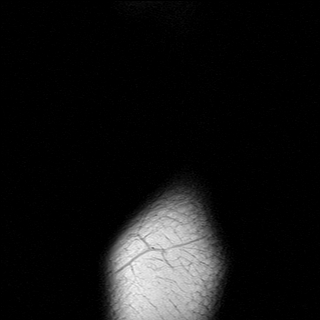
[im 7/34]
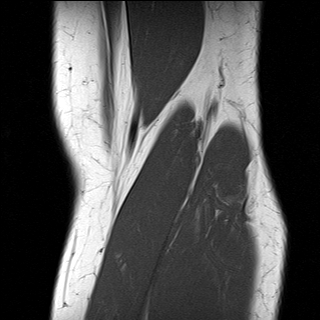
[im 14/34]
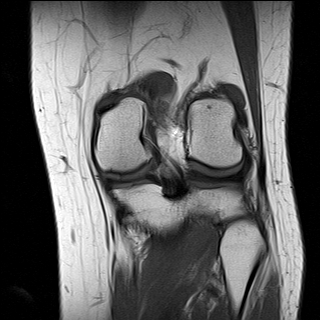
[im 20/34]
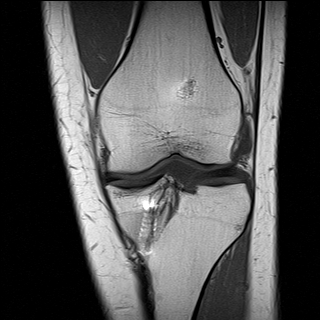
[im 27/34]
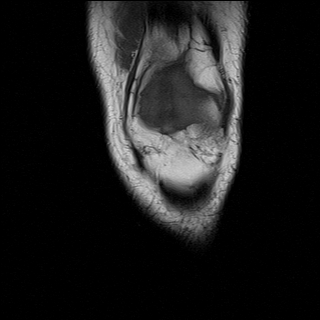
[im 34/34]
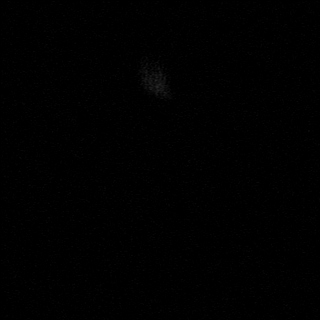

[Series 4: STIR · coronal · 4.0mm · 0.33mm/px · 5 of 34 slices shown]
[im 1/34]
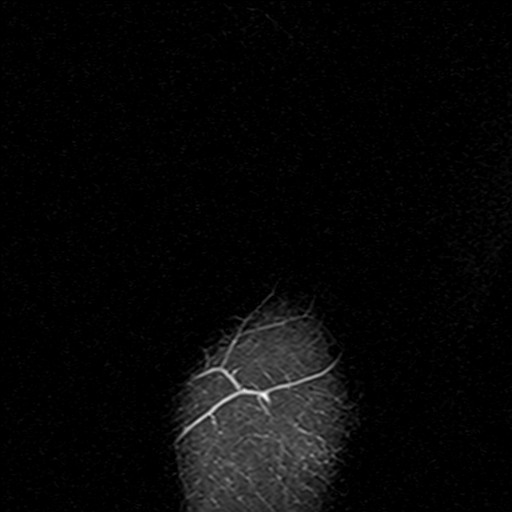
[im 9/34]
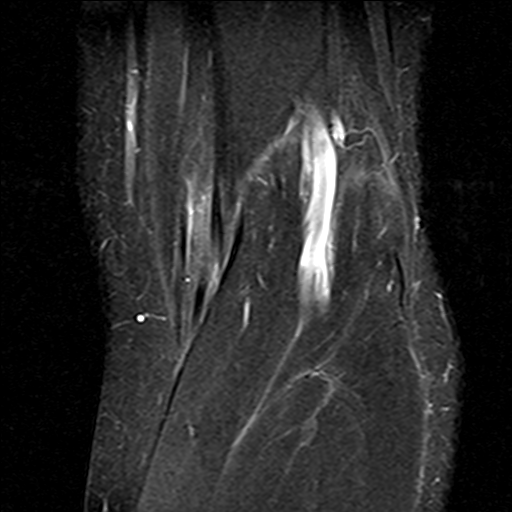
[im 17/34]
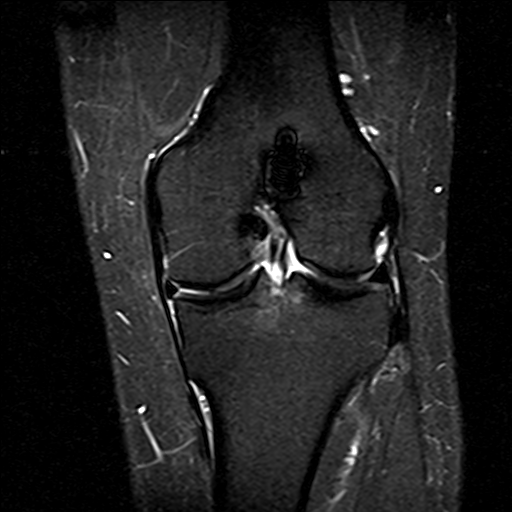
[im 25/34]
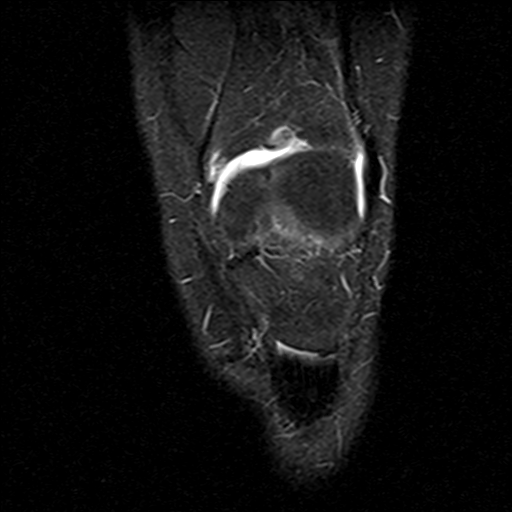
[im 34/34]
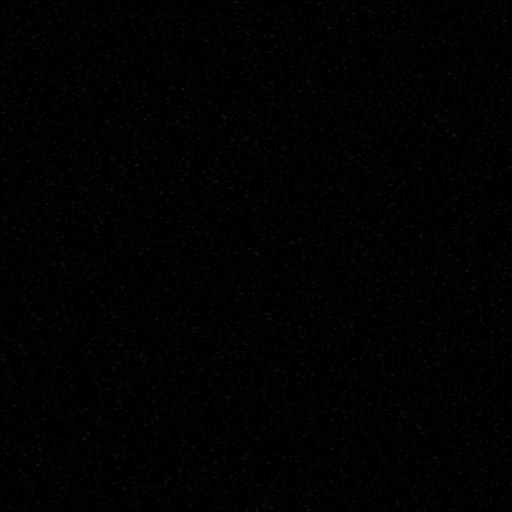

[Series 5: T1 · axial · 4.0mm · 0.66mm/px · z∈[-71,+57]mm · 4 of 30 slices shown (2 of 2)]
[im 1/30]
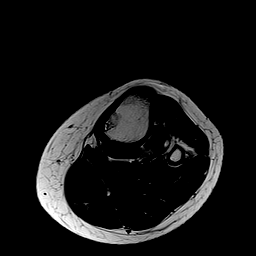
[im 10/30]
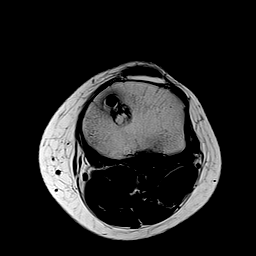
[im 20/30]
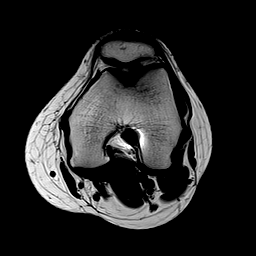
[im 30/30]
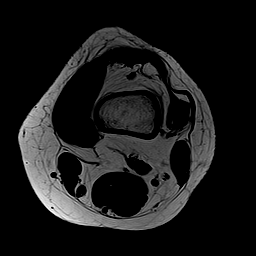

[Series 6: T2 fat-sat · axial · 4.0mm · 0.66mm/px · z∈[-71,+57]mm · 4 of 30 slices shown (1 of 2)]
[im 1/30]
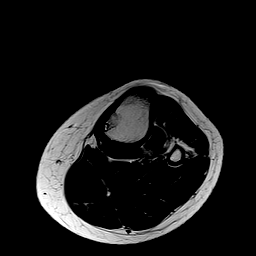
[im 10/30]
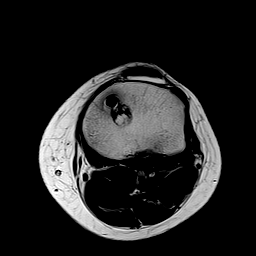
[im 20/30]
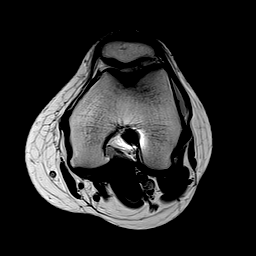
[im 30/30]
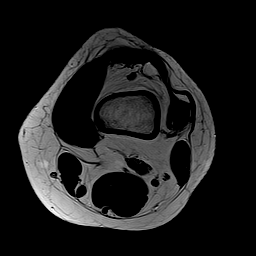

[Series 8: T2 fat-sat · sagittal · 4.0mm · 0.66mm/px · 4 of 27 slices shown (2 of 2)]
[im 1/27]
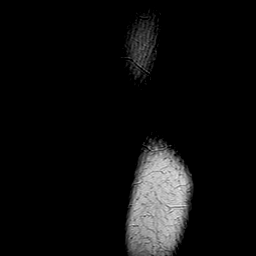
[im 9/27]
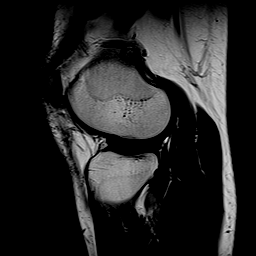
[im 18/27]
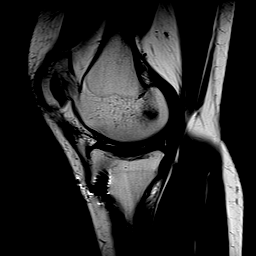
[im 27/27]
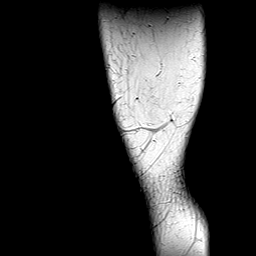

[Series 9: PD · axial · 4.0mm · 0.42mm/px · z∈[-71,+57]mm · 4 of 30 slices shown (1 of 3)]
[im 1/30]
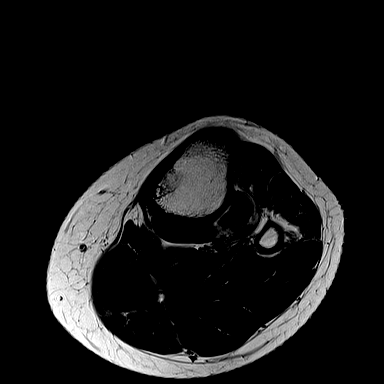
[im 10/30]
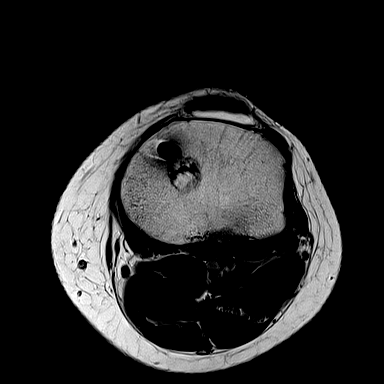
[im 20/30]
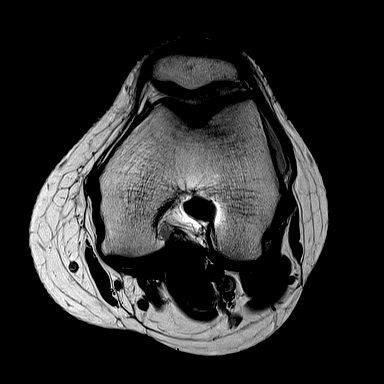
[im 30/30]
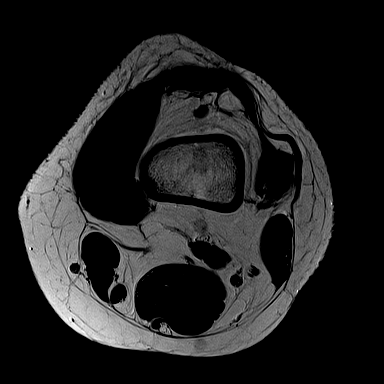

[Series 10: PD · coronal · 4.0mm · 0.42mm/px · 5 of 34 slices shown (2 of 3)]
[im 1/34]
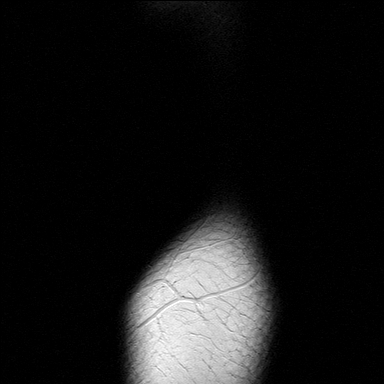
[im 9/34]
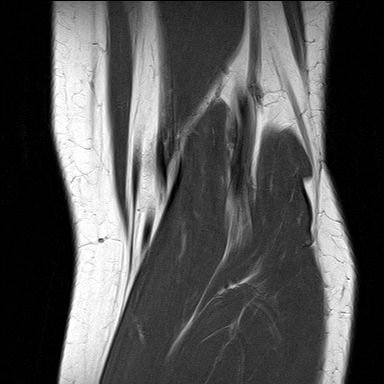
[im 17/34]
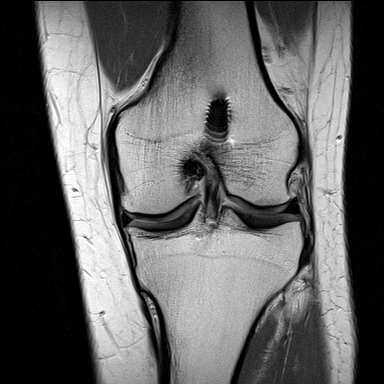
[im 25/34]
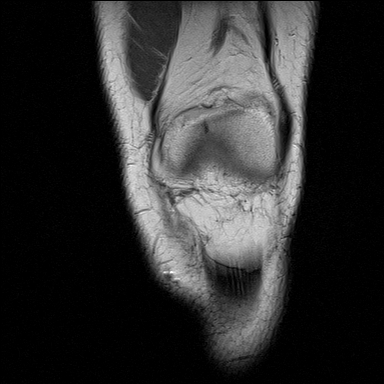
[im 34/34]
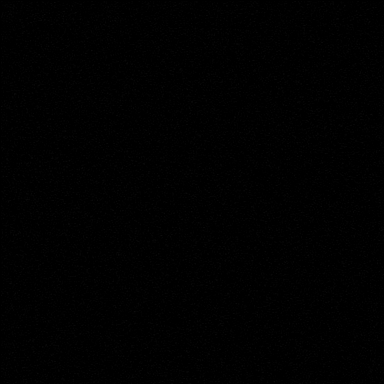

[Series 11: PD · sagittal · 4.0mm · 0.42mm/px · 4 of 27 slices shown (3 of 3)]
[im 1/27]
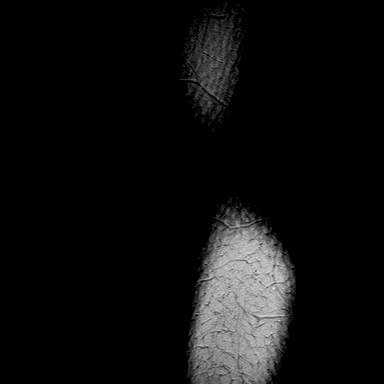
[im 9/27]
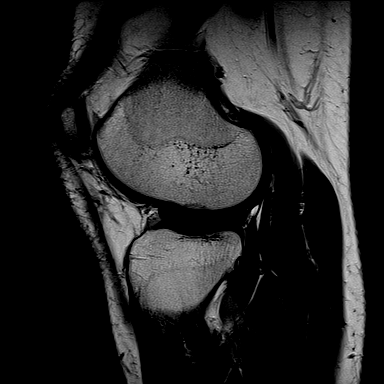
[im 18/27]
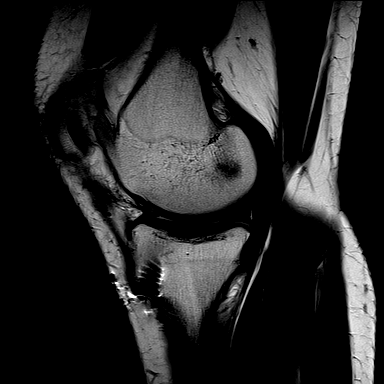
[im 27/27]
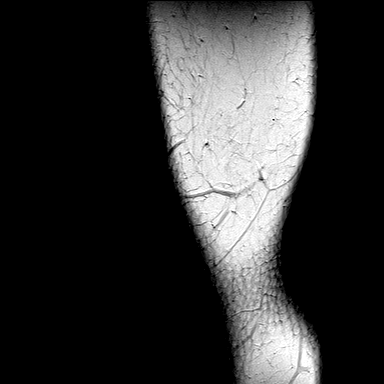

[36 of 40 positions shown; findings below may reference images not displayed]

FINDINGS: MENISCI

Medial meniscus: Suspect postoperative changes involving the medial
meniscus with a partial medial meniscectomy in the midbody region.
No findings suspicious for acute meniscal tear.

Lateral meniscus:  Intact

LIGAMENTS

Cruciates: Surgical changes from ACL reconstruction. The ACL graft
is intact. The PCL is intact.

Collaterals:  Intact

CARTILAGE

Patellofemoral: Areas of mild degenerative chondrosis with chondral
fissuring but normal cartilage thickness and no cartilage defects.

Medial:  Minimal degenerative chondrosis.

Lateral:  Minimal degenerative chondrosis.

Joint:  Small joint effusion.

Popliteal Fossa:  No popliteal mass or Baker's cyst.

Extensor Mechanism: The patella retinacular structures are intact
and the quadriceps and patellar tendons are intact.

Bones: No acute bony findings. No bone contusion, marrow edema or
osteochondral lesion.

Other: Unremarkable knee musculature.
IMPRESSION: 1. Surgical changes from ACL reconstruction. The ACL graft is
intact. The PCL and collateral ligaments are intact.
2. Suspect postoperative changes involving the medial meniscus with
a partial medial meniscectomy in the midbody region. No findings
suspicious for acute meniscal tear.
3. Minimal tricompartmental degenerative chondrosis.
4. Small joint effusion.

## 2022-03-26 ENCOUNTER — Encounter: Payer: Managed Care, Other (non HMO) | Admitting: Plastic Surgery

## 2022-03-26 ENCOUNTER — Encounter: Payer: Self-pay | Admitting: Plastic Surgery

## 2022-03-26 ENCOUNTER — Ambulatory Visit: Payer: Self-pay | Admitting: Plastic Surgery

## 2022-03-26 DIAGNOSIS — Z719 Counseling, unspecified: Secondary | ICD-10-CM

## 2022-03-26 NOTE — Progress Notes (Signed)
Botulinum Toxin Procedure Note  Procedure: Cosmetic botulinum toxin   Pre-operative Diagnosis: Dynamic rhytides   Post-operative Diagnosis: Same  Complications:  None  Brief history: The patient desires botulinum toxin injection of her forehead. I discussed with the patient this proposed procedure of botulinum toxin injections, which is customized depending on the particular needs of the patient. It is performed on facial rhytids as a temporary correction. The alternatives were discussed with the patient. The risks were addressed including bleeding, scarring, infection, damage to deeper structures, asymmetry, and chronic pain, which may occur infrequently after a procedure. The individual's choice to undergo a surgical procedure is based on the comparison of risks to potential benefits. Other risks include unsatisfactory results, brow ptosis, eyelid ptosis, allergic reaction, temporary paralysis, which should go away with time, bruising, blurring disturbances and delayed healing. Botulinum toxin injections do not arrest the aging process or produce permanent tightening of the eyelid.  Operative intervention maybe necessary to maintain the results of a blepharoplasty or botulinum toxin. The patient understands and wishes to proceed.  Procedure: The area was prepped with alcohol and dried with a clean gauze. Using a clean technique, the botulinum toxin was diluted with 1.25 cc of preservative-free normal saline which was slowly injected with an 18 gauge needle in a tuberculin syringes.  A 32 gauge needles were then used to inject the botulinum toxin. This mixture allow for an aliquot of 4 units per 0.1 cc in each injection site.    Subsequently the mixture was injected in the glabellar and forehead area with preservation of the temporal branch to the lateral eyebrow as well as into each lateral canthal area beginning from the lateral orbital rim medial to the zygomaticus major in 3 separate areas. A  total of 30 Units of botulinum toxin was used. The forehead and glabellar area was injected with care to inject intramuscular only while holding pressure on the supratrochlear vessels in each area during each injection on either side of the medial corrugators. The injection proceeded vertically superiorly to the medial 2/3 of the frontalis muscle and superior 2/3 of the lateral frontalis, again with preservation of the frontal branch. No complications were noted. Light pressure was held for 5 minutes. She was instructed explicitly in post-operative care.  Botox LOT:  [id number]  EXP:  2025/05  

## 2022-03-29 DIAGNOSIS — M9901 Segmental and somatic dysfunction of cervical region: Secondary | ICD-10-CM | POA: Diagnosis not present

## 2022-03-29 DIAGNOSIS — M7912 Myalgia of auxiliary muscles, head and neck: Secondary | ICD-10-CM | POA: Diagnosis not present

## 2022-03-29 DIAGNOSIS — M25619 Stiffness of unspecified shoulder, not elsewhere classified: Secondary | ICD-10-CM | POA: Diagnosis not present

## 2022-03-29 DIAGNOSIS — M5412 Radiculopathy, cervical region: Secondary | ICD-10-CM | POA: Diagnosis not present

## 2022-03-29 DIAGNOSIS — G43909 Migraine, unspecified, not intractable, without status migrainosus: Secondary | ICD-10-CM | POA: Diagnosis not present

## 2022-03-29 DIAGNOSIS — M9902 Segmental and somatic dysfunction of thoracic region: Secondary | ICD-10-CM | POA: Diagnosis not present

## 2022-03-30 ENCOUNTER — Encounter: Payer: Managed Care, Other (non HMO) | Admitting: Plastic Surgery

## 2022-04-03 DIAGNOSIS — R87612 Low grade squamous intraepithelial lesion on cytologic smear of cervix (LGSIL): Secondary | ICD-10-CM | POA: Diagnosis not present

## 2022-04-03 DIAGNOSIS — R87810 Cervical high risk human papillomavirus (HPV) DNA test positive: Secondary | ICD-10-CM | POA: Diagnosis not present

## 2022-04-03 DIAGNOSIS — Z3202 Encounter for pregnancy test, result negative: Secondary | ICD-10-CM | POA: Diagnosis not present

## 2022-04-03 DIAGNOSIS — N879 Dysplasia of cervix uteri, unspecified: Secondary | ICD-10-CM | POA: Diagnosis not present

## 2022-04-13 DIAGNOSIS — M7912 Myalgia of auxiliary muscles, head and neck: Secondary | ICD-10-CM | POA: Diagnosis not present

## 2022-04-13 DIAGNOSIS — M25619 Stiffness of unspecified shoulder, not elsewhere classified: Secondary | ICD-10-CM | POA: Diagnosis not present

## 2022-04-13 DIAGNOSIS — M5412 Radiculopathy, cervical region: Secondary | ICD-10-CM | POA: Diagnosis not present

## 2022-04-13 DIAGNOSIS — G43909 Migraine, unspecified, not intractable, without status migrainosus: Secondary | ICD-10-CM | POA: Diagnosis not present

## 2022-04-13 DIAGNOSIS — M9902 Segmental and somatic dysfunction of thoracic region: Secondary | ICD-10-CM | POA: Diagnosis not present

## 2022-04-13 DIAGNOSIS — M9901 Segmental and somatic dysfunction of cervical region: Secondary | ICD-10-CM | POA: Diagnosis not present

## 2022-04-18 ENCOUNTER — Telehealth: Payer: Self-pay

## 2022-04-18 NOTE — Telephone Encounter (Signed)
Received forms for PA for Ubrevly 50 mg. Pa was sent to plan and received approval from pt's insurance. Pt aware of this. Key: [id number]

## 2022-04-27 ENCOUNTER — Encounter: Payer: Managed Care, Other (non HMO) | Admitting: Plastic Surgery

## 2022-04-30 DIAGNOSIS — M25511 Pain in right shoulder: Secondary | ICD-10-CM | POA: Diagnosis not present

## 2022-04-30 DIAGNOSIS — M1711 Unilateral primary osteoarthritis, right knee: Secondary | ICD-10-CM | POA: Diagnosis not present

## 2022-05-07 NOTE — Telephone Encounter (Signed)
PA initiated and sent to pt insurance for approval/denial. Key [id number]

## 2022-05-07 NOTE — Telephone Encounter (Signed)
Pt is calling and the pharm still needs PA for medication [id number] [place], [place] - [address] AT [facility] OF [facility] [address] Phone:  [phone]  Fax:  [phone]

## 2022-05-11 DIAGNOSIS — G43909 Migraine, unspecified, not intractable, without status migrainosus: Secondary | ICD-10-CM | POA: Diagnosis not present

## 2022-06-01 DIAGNOSIS — M9902 Segmental and somatic dysfunction of thoracic region: Secondary | ICD-10-CM | POA: Diagnosis not present

## 2022-06-01 DIAGNOSIS — M5412 Radiculopathy, cervical region: Secondary | ICD-10-CM | POA: Diagnosis not present

## 2022-06-01 DIAGNOSIS — M9901 Segmental and somatic dysfunction of cervical region: Secondary | ICD-10-CM | POA: Diagnosis not present

## 2022-06-01 DIAGNOSIS — G43909 Migraine, unspecified, not intractable, without status migrainosus: Secondary | ICD-10-CM | POA: Diagnosis not present

## 2022-06-15 DIAGNOSIS — M9902 Segmental and somatic dysfunction of thoracic region: Secondary | ICD-10-CM | POA: Diagnosis not present

## 2022-06-15 DIAGNOSIS — M9901 Segmental and somatic dysfunction of cervical region: Secondary | ICD-10-CM | POA: Diagnosis not present

## 2022-06-15 DIAGNOSIS — M5412 Radiculopathy, cervical region: Secondary | ICD-10-CM | POA: Diagnosis not present

## 2022-06-15 DIAGNOSIS — M25619 Stiffness of unspecified shoulder, not elsewhere classified: Secondary | ICD-10-CM | POA: Diagnosis not present

## 2022-06-15 DIAGNOSIS — G43909 Migraine, unspecified, not intractable, without status migrainosus: Secondary | ICD-10-CM | POA: Diagnosis not present

## 2022-06-15 DIAGNOSIS — M7912 Myalgia of auxiliary muscles, head and neck: Secondary | ICD-10-CM | POA: Diagnosis not present

## 2022-06-26 DIAGNOSIS — G43909 Migraine, unspecified, not intractable, without status migrainosus: Secondary | ICD-10-CM | POA: Diagnosis not present

## 2022-06-26 DIAGNOSIS — M9902 Segmental and somatic dysfunction of thoracic region: Secondary | ICD-10-CM | POA: Diagnosis not present

## 2022-06-26 DIAGNOSIS — M7912 Myalgia of auxiliary muscles, head and neck: Secondary | ICD-10-CM | POA: Diagnosis not present

## 2022-06-26 DIAGNOSIS — M25619 Stiffness of unspecified shoulder, not elsewhere classified: Secondary | ICD-10-CM | POA: Diagnosis not present

## 2022-06-26 DIAGNOSIS — M9901 Segmental and somatic dysfunction of cervical region: Secondary | ICD-10-CM | POA: Diagnosis not present

## 2022-06-26 DIAGNOSIS — M5412 Radiculopathy, cervical region: Secondary | ICD-10-CM | POA: Diagnosis not present

## 2022-07-05 DIAGNOSIS — M1712 Unilateral primary osteoarthritis, left knee: Secondary | ICD-10-CM | POA: Diagnosis not present

## 2022-07-10 DIAGNOSIS — Z1389 Encounter for screening for other disorder: Secondary | ICD-10-CM | POA: Diagnosis not present

## 2022-07-10 DIAGNOSIS — Z01419 Encounter for gynecological examination (general) (routine) without abnormal findings: Secondary | ICD-10-CM | POA: Diagnosis not present

## 2022-07-12 DIAGNOSIS — M1712 Unilateral primary osteoarthritis, left knee: Secondary | ICD-10-CM | POA: Diagnosis not present

## 2022-07-17 DIAGNOSIS — M25511 Pain in right shoulder: Secondary | ICD-10-CM | POA: Diagnosis not present

## 2022-07-19 DIAGNOSIS — M1712 Unilateral primary osteoarthritis, left knee: Secondary | ICD-10-CM | POA: Diagnosis not present

## 2022-07-20 DIAGNOSIS — M5412 Radiculopathy, cervical region: Secondary | ICD-10-CM | POA: Diagnosis not present

## 2022-07-20 DIAGNOSIS — M9901 Segmental and somatic dysfunction of cervical region: Secondary | ICD-10-CM | POA: Diagnosis not present

## 2022-07-20 DIAGNOSIS — M9902 Segmental and somatic dysfunction of thoracic region: Secondary | ICD-10-CM | POA: Diagnosis not present

## 2022-07-20 DIAGNOSIS — G43909 Migraine, unspecified, not intractable, without status migrainosus: Secondary | ICD-10-CM | POA: Diagnosis not present

## 2022-07-21 ENCOUNTER — Other Ambulatory Visit: Payer: Self-pay | Admitting: Family Medicine

## 2022-07-23 DIAGNOSIS — S43431A Superior glenoid labrum lesion of right shoulder, initial encounter: Secondary | ICD-10-CM | POA: Diagnosis not present

## 2022-08-03 DIAGNOSIS — M25619 Stiffness of unspecified shoulder, not elsewhere classified: Secondary | ICD-10-CM | POA: Diagnosis not present

## 2022-08-03 DIAGNOSIS — M5412 Radiculopathy, cervical region: Secondary | ICD-10-CM | POA: Diagnosis not present

## 2022-08-03 DIAGNOSIS — M9902 Segmental and somatic dysfunction of thoracic region: Secondary | ICD-10-CM | POA: Diagnosis not present

## 2022-08-03 DIAGNOSIS — G43909 Migraine, unspecified, not intractable, without status migrainosus: Secondary | ICD-10-CM | POA: Diagnosis not present

## 2022-08-03 DIAGNOSIS — M9901 Segmental and somatic dysfunction of cervical region: Secondary | ICD-10-CM | POA: Diagnosis not present

## 2022-08-03 DIAGNOSIS — M7912 Myalgia of auxiliary muscles, head and neck: Secondary | ICD-10-CM | POA: Diagnosis not present

## 2022-08-09 DIAGNOSIS — G43809 Other migraine, not intractable, without status migrainosus: Secondary | ICD-10-CM | POA: Diagnosis not present

## 2022-08-17 DIAGNOSIS — M7918 Myalgia, other site: Secondary | ICD-10-CM | POA: Diagnosis not present

## 2022-08-17 DIAGNOSIS — G43909 Migraine, unspecified, not intractable, without status migrainosus: Secondary | ICD-10-CM | POA: Diagnosis not present

## 2022-08-17 DIAGNOSIS — M25619 Stiffness of unspecified shoulder, not elsewhere classified: Secondary | ICD-10-CM | POA: Diagnosis not present

## 2022-08-17 DIAGNOSIS — M9902 Segmental and somatic dysfunction of thoracic region: Secondary | ICD-10-CM | POA: Diagnosis not present

## 2022-08-17 DIAGNOSIS — M5412 Radiculopathy, cervical region: Secondary | ICD-10-CM | POA: Diagnosis not present

## 2022-08-17 DIAGNOSIS — M9901 Segmental and somatic dysfunction of cervical region: Secondary | ICD-10-CM | POA: Diagnosis not present

## 2022-08-21 DIAGNOSIS — M25511 Pain in right shoulder: Secondary | ICD-10-CM | POA: Diagnosis not present

## 2022-08-24 DIAGNOSIS — R635 Abnormal weight gain: Secondary | ICD-10-CM | POA: Diagnosis not present

## 2022-08-24 DIAGNOSIS — N951 Menopausal and female climacteric states: Secondary | ICD-10-CM | POA: Diagnosis not present

## 2022-08-24 DIAGNOSIS — Z130 Encounter for screening for diseases of the blood and blood-forming organs and certain disorders involving the immune mechanism: Secondary | ICD-10-CM | POA: Diagnosis not present

## 2022-08-24 DIAGNOSIS — Z131 Encounter for screening for diabetes mellitus: Secondary | ICD-10-CM | POA: Diagnosis not present

## 2022-08-24 DIAGNOSIS — Z1329 Encounter for screening for other suspected endocrine disorder: Secondary | ICD-10-CM | POA: Diagnosis not present

## 2022-08-24 DIAGNOSIS — E7800 Pure hypercholesterolemia, unspecified: Secondary | ICD-10-CM | POA: Diagnosis not present

## 2022-08-28 DIAGNOSIS — Z1331 Encounter for screening for depression: Secondary | ICD-10-CM | POA: Diagnosis not present

## 2022-08-28 DIAGNOSIS — R7989 Other specified abnormal findings of blood chemistry: Secondary | ICD-10-CM | POA: Diagnosis not present

## 2022-08-28 DIAGNOSIS — E7800 Pure hypercholesterolemia, unspecified: Secondary | ICD-10-CM | POA: Diagnosis not present

## 2022-08-28 DIAGNOSIS — Z1339 Encounter for screening examination for other mental health and behavioral disorders: Secondary | ICD-10-CM | POA: Diagnosis not present

## 2022-08-28 DIAGNOSIS — N951 Menopausal and female climacteric states: Secondary | ICD-10-CM | POA: Diagnosis not present

## 2022-09-07 DIAGNOSIS — M25619 Stiffness of unspecified shoulder, not elsewhere classified: Secondary | ICD-10-CM | POA: Diagnosis not present

## 2022-09-07 DIAGNOSIS — M5412 Radiculopathy, cervical region: Secondary | ICD-10-CM | POA: Diagnosis not present

## 2022-09-07 DIAGNOSIS — M9901 Segmental and somatic dysfunction of cervical region: Secondary | ICD-10-CM | POA: Diagnosis not present

## 2022-09-07 DIAGNOSIS — G43909 Migraine, unspecified, not intractable, without status migrainosus: Secondary | ICD-10-CM | POA: Diagnosis not present

## 2022-09-07 DIAGNOSIS — M7912 Myalgia of auxiliary muscles, head and neck: Secondary | ICD-10-CM | POA: Diagnosis not present

## 2022-09-07 DIAGNOSIS — M9902 Segmental and somatic dysfunction of thoracic region: Secondary | ICD-10-CM | POA: Diagnosis not present

## 2022-09-17 DIAGNOSIS — S43431A Superior glenoid labrum lesion of right shoulder, initial encounter: Secondary | ICD-10-CM | POA: Diagnosis not present

## 2022-09-21 DIAGNOSIS — M25619 Stiffness of unspecified shoulder, not elsewhere classified: Secondary | ICD-10-CM | POA: Diagnosis not present

## 2022-09-21 DIAGNOSIS — M7918 Myalgia, other site: Secondary | ICD-10-CM | POA: Diagnosis not present

## 2022-09-21 DIAGNOSIS — M5412 Radiculopathy, cervical region: Secondary | ICD-10-CM | POA: Diagnosis not present

## 2022-09-21 DIAGNOSIS — G43909 Migraine, unspecified, not intractable, without status migrainosus: Secondary | ICD-10-CM | POA: Diagnosis not present

## 2022-09-21 DIAGNOSIS — M9902 Segmental and somatic dysfunction of thoracic region: Secondary | ICD-10-CM | POA: Diagnosis not present

## 2022-09-21 DIAGNOSIS — M9901 Segmental and somatic dysfunction of cervical region: Secondary | ICD-10-CM | POA: Diagnosis not present

## 2022-09-27 DIAGNOSIS — E7800 Pure hypercholesterolemia, unspecified: Secondary | ICD-10-CM | POA: Diagnosis not present

## 2022-09-27 DIAGNOSIS — Z6823 Body mass index (BMI) 23.0-23.9, adult: Secondary | ICD-10-CM | POA: Diagnosis not present

## 2022-10-04 DIAGNOSIS — M5412 Radiculopathy, cervical region: Secondary | ICD-10-CM | POA: Diagnosis not present

## 2022-10-04 DIAGNOSIS — M9902 Segmental and somatic dysfunction of thoracic region: Secondary | ICD-10-CM | POA: Diagnosis not present

## 2022-10-04 DIAGNOSIS — G43909 Migraine, unspecified, not intractable, without status migrainosus: Secondary | ICD-10-CM | POA: Diagnosis not present

## 2022-10-04 DIAGNOSIS — M9901 Segmental and somatic dysfunction of cervical region: Secondary | ICD-10-CM | POA: Diagnosis not present

## 2022-10-25 DIAGNOSIS — Z6822 Body mass index (BMI) 22.0-22.9, adult: Secondary | ICD-10-CM | POA: Diagnosis not present

## 2022-10-25 DIAGNOSIS — E7800 Pure hypercholesterolemia, unspecified: Secondary | ICD-10-CM | POA: Diagnosis not present

## 2022-11-02 ENCOUNTER — Ambulatory Visit: Payer: BC Managed Care – PPO | Admitting: Family Medicine

## 2022-11-02 ENCOUNTER — Encounter: Payer: Self-pay | Admitting: Family Medicine

## 2022-11-02 VITALS — BP 120/80 | HR 75 | Temp 98.3°F | Ht 68.0 in | Wt 151.7 lb

## 2022-11-02 DIAGNOSIS — H019 Unspecified inflammation of eyelid: Secondary | ICD-10-CM | POA: Diagnosis not present

## 2022-11-02 DIAGNOSIS — R41840 Attention and concentration deficit: Secondary | ICD-10-CM

## 2022-11-02 DIAGNOSIS — G4700 Insomnia, unspecified: Secondary | ICD-10-CM

## 2022-11-02 DIAGNOSIS — L719 Rosacea, unspecified: Secondary | ICD-10-CM | POA: Diagnosis not present

## 2022-11-02 NOTE — Progress Notes (Unsigned)
Established Patient Office Visit  Subjective   Patient ID: Elizabeth Washington, female    DOB: Sep 05, 1986  Age: 37 y.o. MRN: [id number]  Chief Complaint  Patient presents with   Medication Consultation    HPI  {History (Optional):23778} Elizabeth Washington is seen today for consultation regarding some recent attention issues.  She stays quite busy between work and raising family with 2 children ages 18 and 34.  She feels like she is having increasing difficulty with focus.  She has difficulty completing task.  Easy distraction.  Frequently feels "fidgety ".  She is having difficulty sleeping at times.  She has difficulty turning her mind off from the day.  She also notices that things like noise seem to distract her from task at hand.  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  No prior history of ADD.  She has never been formally tested or screened.  Denies any depression symptoms.  Denies any specific stress issues.  She does have history of migraine headaches and takes [place] for that.  Several years ago took Topamax briefly but did not like the way that made her feel.  Regarding her insomnia, she does not use any alcohol regularly.  No late day use of caffeine.  Does frequently have the TV on at night to fall asleep.  Past Medical History:  Diagnosis Date   Kidney stone    Migraine    Miscarriage, threatened, early pregnancy 11/28/2014   Normal intrauterine pregnancy in third trimester 02/10/2016   SVD (spontaneous vaginal delivery) 02/11/2016   SVD (spontaneous vaginal delivery) 07/14/2017   Past Surgical History:  Procedure Laterality Date   KNEE SURGERY  2008, 2009, 2010   x3    reports that she has never smoked. She has never used smokeless tobacco. She reports current alcohol use. She reports that she does not use drugs. family history includes Arthritis in her mother; Cancer in her unknown  relative; Heart disease in her father; Hyperlipidemia in her father. No Known Allergies  Review of Systems  Constitutional:  Negative for chills and fever.  Respiratory:  Negative for shortness of breath.   Cardiovascular:  Negative for chest pain.  Neurological:  Negative for dizziness and headaches.  Psychiatric/Behavioral:  Negative for depression, memory loss and substance abuse. The patient has insomnia. The patient is not nervous/anxious.       Objective:     BP 120/80 (BP Location: Left Arm, Patient Position: Sitting, Cuff Size: Normal)   Pulse 75   Temp 98.3 F (36.8 C) (Oral)   Ht 5\' 8"  (1.727 m)   Wt 151 lb 11.2 oz (68.8 kg)   SpO2 99%   BMI 23.07 kg/m  {Vitals History (Optional):23777}  Physical Exam Vitals reviewed.  Constitutional:      Appearance: Normal appearance.  Cardiovascular:     Rate and Rhythm: Normal rate and regular rhythm.     Heart sounds: No murmur heard. Pulmonary:     Effort: Pulmonary effort is normal.     Breath sounds: Normal breath sounds.  Neurological:     General: No focal deficit present.     Mental Status: She is alert.  Psychiatric:        Mood and Affect: Mood normal.        Thought Content: Thought content normal.      No results found for any visits on 11/02/22.  {  Labs (Optional):23779}  The ASCVD Risk score (Arnett DK, et al., 2019) failed to calculate for the following reasons:   The 2019 ASCVD risk score is only valid for ages 26 to 48    Assessment & Plan:   #1 insomnia.  Sleep hygiene discussed.  Handout given.  Avoid bright lights within 2 to 3 hours of bedtime.  Avoid regular use of sedative hypnotic medications.  #2 concern for possible adult ADD.  We gave her brochure for our behavioral health division to schedule further assessment and she will consider calling and setting that up.  Elizabeth Littler, MD

## 2022-11-02 NOTE — Patient Instructions (Signed)
Consider setting up ADD assessment, as discussed.

## 2022-11-05 ENCOUNTER — Encounter: Payer: Self-pay | Admitting: Surgical

## 2022-11-05 DIAGNOSIS — R519 Headache, unspecified: Secondary | ICD-10-CM | POA: Diagnosis not present

## 2022-11-08 ENCOUNTER — Encounter: Payer: Self-pay | Admitting: Family Medicine

## 2022-11-08 DIAGNOSIS — M9902 Segmental and somatic dysfunction of thoracic region: Secondary | ICD-10-CM | POA: Diagnosis not present

## 2022-11-08 DIAGNOSIS — G43909 Migraine, unspecified, not intractable, without status migrainosus: Secondary | ICD-10-CM | POA: Diagnosis not present

## 2022-11-08 DIAGNOSIS — M9901 Segmental and somatic dysfunction of cervical region: Secondary | ICD-10-CM | POA: Diagnosis not present

## 2022-11-08 DIAGNOSIS — M5412 Radiculopathy, cervical region: Secondary | ICD-10-CM | POA: Diagnosis not present

## 2022-11-11 ENCOUNTER — Other Ambulatory Visit: Payer: Self-pay | Admitting: Family Medicine

## 2022-11-11 MED ORDER — AIMOVIG 140 MG/ML ~~LOC~~ SOAJ: 140.0000 mg | SUBCUTANEOUS | 11 refills | Status: DC

## 2022-11-12 ENCOUNTER — Ambulatory Visit: Payer: BC Managed Care – PPO | Admitting: Internal Medicine

## 2022-11-12 ENCOUNTER — Encounter: Payer: Self-pay | Admitting: Surgical

## 2022-11-12 ENCOUNTER — Encounter: Payer: Self-pay | Admitting: Internal Medicine

## 2022-11-12 VITALS — BP 120/80 | HR 80 | Temp 98.0°F | Wt 149.6 lb

## 2022-11-12 DIAGNOSIS — G43011 Migraine without aura, intractable, with status migrainosus: Secondary | ICD-10-CM | POA: Diagnosis not present

## 2022-11-12 MED ORDER — PREDNISONE 20 MG PO TABS: 20.0000 mg | ORAL_TABLET | Freq: Once | ORAL | 0 refills | Status: AC

## 2022-11-12 MED ORDER — PROMETHAZINE HCL 25 MG PO TABS: 25.0000 mg | ORAL_TABLET | Freq: Once | ORAL | 0 refills | Status: DC

## 2022-11-12 MED ORDER — AIMOVIG 140 MG/ML ~~LOC~~ SOAJ: 140.0000 mg | SUBCUTANEOUS | 11 refills | Status: DC

## 2022-11-12 NOTE — Progress Notes (Signed)
Established Patient Office Visit     CC/Reason for Visit: Intractable migraine  HPI: Elizabeth Washington is a 37 y.o. female who is coming in today for the above mentioned reasons. Past Medical History is significant for: Migraine headaches that she has dealt with for years.  Most recently she has been aborting them with [place].  Migraine frequency has increased and is becoming more severe.  In the last 2 months she has had about 15 migraine days.  The last migraine started last week.  She has visited urgent care twice where she has received a combination of Phenergan and prednisone with momentary relief but not complete relief.   Past Medical/Surgical History: Past Medical History:  Diagnosis Date   Kidney stone    Migraine    Miscarriage, threatened, early pregnancy 11/28/2014   Normal intrauterine pregnancy in third trimester 02/10/2016   SVD (spontaneous vaginal delivery) 02/11/2016   SVD (spontaneous vaginal delivery) 07/14/2017    Past Surgical History:  Procedure Laterality Date   KNEE SURGERY  2008, 2009, 2010   x3    Social History:  reports that she has never smoked. She has never used smokeless tobacco. She reports current alcohol use. She reports that she does not use drugs.  Allergies: No Known Allergies  Family History:  Family History  Problem Relation Age of Onset   Arthritis Mother    Hyperlipidemia Father    Heart disease Father    Cancer Unknown        prostate/grandfather     Current Outpatient Medications:    acetaminophen (TYLENOL) 500 MG tablet, Take 1,000 mg by mouth every 6 (six) hours as needed for mild pain, moderate pain, fever or headache., Disp: , Rfl:    acetaZOLAMIDE (DIAMOX) 125 MG tablet, Take 1 tablet by mouth twice daily starting 24 hours before ascent and discontinue 2 to 3 days after peak arrival or upon descent, Disp: 20 tablet, Rfl: 0   ibuprofen (ADVIL,MOTRIN) 800 MG tablet, Take 1 tablet (800 mg total) by mouth every 8 (eight)  hours as needed., Disp: 45 tablet, Rfl: 1   predniSONE (DELTASONE) 20 MG tablet, Take 1 tablet (20 mg total) by mouth once for 1 dose., Disp: 1 tablet, Rfl: 0   promethazine (PHENERGAN) 25 MG tablet, Take 1 tablet (25 mg total) by mouth once for 1 dose., Disp: 1 tablet, Rfl: 0   Ubrogepant (UBRELVY) 50 MG TABS, TAKE ONE AT ONSET OF MIGRAINE HEADACHE AND MAY REPEAT DOSE ONCE IN TWO HOURS IF NECESSARY, Disp: 10 tablet, Rfl: 4   Erenumab-aooe (AIMOVIG) 140 MG/ML SOAJ, Inject 140 mg into the skin every 30 (thirty) days., Disp: 1.12 mL, Rfl: 11  Review of Systems:  Negative unless indicated in HPI.   Physical Exam: Vitals:   11/12/22 1601  BP: 120/80  Pulse: 80  Temp: 98 F (36.7 C)  TempSrc: Oral  SpO2: 98%  Weight: 149 lb 9.6 oz (67.9 kg)    Body mass index is 22.75 kg/m.   Physical Exam Vitals reviewed.  Constitutional:      Appearance: Normal appearance.  HENT:     Head: Normocephalic and atraumatic.  Skin:    General: Skin is warm and dry.  Neurological:     General: No focal deficit present.     Mental Status: She is alert and oriented to person, place, and time.  Psychiatric:        Mood and Affect: Mood normal.  Behavior: Behavior normal.        Thought Content: Thought content normal.        Judgment: Judgment normal.      Impression and Plan:  Intractable migraine without aura and with status migrainosus - Plan: Erenumab-aooe (AIMOVIG) 140 MG/ML SOAJ, predniSONE (DELTASONE) 20 MG tablet, promethazine (PHENERGAN) 25 MG tablet  -She is having migraines that are not well-controlled with Roselyn Meier.  I will start her on Aimovig once a month.  For tonight have sent in prednisone 20 mg and Phenergan 25 mg that she can take together with 50 mg of Benadryl to see if she can abort her current, severe migraine. -If migraines continue to be an issue, referral to neurology may be appropriate.  Time spent:31 minutes reviewing chart, interviewing and examining patient  and formulating plan of care.     Lelon Frohlich, MD [facility]

## 2022-11-19 ENCOUNTER — Telehealth: Payer: Self-pay | Admitting: Family Medicine

## 2022-11-19 NOTE — Telephone Encounter (Signed)
Pt was last seen on 11/12/22 by Dr. Jerilee Hoh.  Pt called to say her insurance is telling her they are still waiting for the PA in order to fill the Rx for the following:  Erenumab-aooe (AIMOVIG) 140 MG/ML SOAJ   Please send to the pharmacy below:  [facility], [address] AT [place]

## 2022-11-20 ENCOUNTER — Other Ambulatory Visit (HOSPITAL_COMMUNITY): Payer: Self-pay

## 2022-11-20 ENCOUNTER — Telehealth: Payer: Self-pay

## 2022-11-20 NOTE — Telephone Encounter (Signed)
PA pending. Created new telephone encounter for PA 

## 2022-11-20 NOTE — Telephone Encounter (Signed)
Pharmacy Patient Advocate Encounter   Received notification that prior authorization for Aimovig 140MG /ML auto-injectors is required/requested.  Per Test Claim: PA required   PA submitted on 11/20/22 to (ins) [place] via CoverMyMeds Key [id number] Status is pending

## 2022-11-21 NOTE — Telephone Encounter (Signed)
Patient Advocate Encounter  Prior Authorization for Aimovig 140MG /ML auto-injectors has been approved.    Effective dates: 11/20/22 through 02/11/23

## 2022-11-21 NOTE — Telephone Encounter (Signed)
Patient informed that medication has been approved

## 2022-11-23 DIAGNOSIS — M5412 Radiculopathy, cervical region: Secondary | ICD-10-CM | POA: Diagnosis not present

## 2022-11-23 DIAGNOSIS — M9902 Segmental and somatic dysfunction of thoracic region: Secondary | ICD-10-CM | POA: Diagnosis not present

## 2022-11-23 DIAGNOSIS — M25619 Stiffness of unspecified shoulder, not elsewhere classified: Secondary | ICD-10-CM | POA: Diagnosis not present

## 2022-11-23 DIAGNOSIS — G43909 Migraine, unspecified, not intractable, without status migrainosus: Secondary | ICD-10-CM | POA: Diagnosis not present

## 2022-11-23 DIAGNOSIS — M9901 Segmental and somatic dysfunction of cervical region: Secondary | ICD-10-CM | POA: Diagnosis not present

## 2022-11-23 DIAGNOSIS — M7918 Myalgia, other site: Secondary | ICD-10-CM | POA: Diagnosis not present

## 2022-11-28 DIAGNOSIS — E7800 Pure hypercholesterolemia, unspecified: Secondary | ICD-10-CM | POA: Diagnosis not present

## 2022-11-28 DIAGNOSIS — Z6822 Body mass index (BMI) 22.0-22.9, adult: Secondary | ICD-10-CM | POA: Diagnosis not present

## 2022-12-05 ENCOUNTER — Other Ambulatory Visit: Payer: Self-pay | Admitting: Family Medicine

## 2022-12-06 NOTE — Telephone Encounter (Signed)
Pt called to request that the refill for the acetaZOLAMIDE (DIAMOX) 125 MG tablet   Please be sent to:  [facility] [place], [address] AT [place] Phone: [phone]  Fax: [phone]   Instead of the [facility] on [address]

## 2022-12-07 DIAGNOSIS — M9902 Segmental and somatic dysfunction of thoracic region: Secondary | ICD-10-CM | POA: Diagnosis not present

## 2022-12-07 DIAGNOSIS — M9901 Segmental and somatic dysfunction of cervical region: Secondary | ICD-10-CM | POA: Diagnosis not present

## 2022-12-07 DIAGNOSIS — G43909 Migraine, unspecified, not intractable, without status migrainosus: Secondary | ICD-10-CM | POA: Diagnosis not present

## 2022-12-07 DIAGNOSIS — M5412 Radiculopathy, cervical region: Secondary | ICD-10-CM | POA: Diagnosis not present

## 2022-12-07 MED ORDER — ACETAZOLAMIDE 125 MG PO TABS: ORAL_TABLET | ORAL | 0 refills | Status: DC

## 2022-12-14 ENCOUNTER — Other Ambulatory Visit: Payer: Self-pay | Admitting: Family Medicine

## 2022-12-17 DIAGNOSIS — M25511 Pain in right shoulder: Secondary | ICD-10-CM | POA: Diagnosis not present

## 2023-01-14 DIAGNOSIS — S43431A Superior glenoid labrum lesion of right shoulder, initial encounter: Secondary | ICD-10-CM | POA: Diagnosis not present

## 2023-01-25 DIAGNOSIS — M7918 Myalgia, other site: Secondary | ICD-10-CM | POA: Diagnosis not present

## 2023-01-25 DIAGNOSIS — M9902 Segmental and somatic dysfunction of thoracic region: Secondary | ICD-10-CM | POA: Diagnosis not present

## 2023-01-25 DIAGNOSIS — G43909 Migraine, unspecified, not intractable, without status migrainosus: Secondary | ICD-10-CM | POA: Diagnosis not present

## 2023-01-25 DIAGNOSIS — M25619 Stiffness of unspecified shoulder, not elsewhere classified: Secondary | ICD-10-CM | POA: Diagnosis not present

## 2023-01-25 DIAGNOSIS — M9901 Segmental and somatic dysfunction of cervical region: Secondary | ICD-10-CM | POA: Diagnosis not present

## 2023-01-25 DIAGNOSIS — M5412 Radiculopathy, cervical region: Secondary | ICD-10-CM | POA: Diagnosis not present

## 2023-02-14 DIAGNOSIS — L719 Rosacea, unspecified: Secondary | ICD-10-CM | POA: Diagnosis not present

## 2023-02-18 DIAGNOSIS — M25561 Pain in right knee: Secondary | ICD-10-CM | POA: Diagnosis not present

## 2023-02-20 ENCOUNTER — Telehealth: Payer: Self-pay | Admitting: Family Medicine

## 2023-02-20 DIAGNOSIS — G43011 Migraine without aura, intractable, with status migrainosus: Secondary | ICD-10-CM

## 2023-02-20 NOTE — Telephone Encounter (Signed)
I spoke with the patient and informed her that a PA is needed for this medications and they PA has been sent to plans and currently awaiting response. Key:  [id number]

## 2023-02-20 NOTE — Telephone Encounter (Signed)
Prescription Request  02/20/2023  LOV: 11/02/2022  What is the name of the medication or equipment? Aimovig Erenumab-aooe (AIMOVIG) 140 MG/ML SOAJ  Have you contacted your pharmacy to request a refill? Yes   Pt states she is completely out and was supposed to take it today.  Pt aware MD is OOO until June 2024.   Which pharmacy would you like this sent to?   [id number] - [place], [place] - [address] AT [facility] OF [address] Phone: [phone]  Fax: [phone]       Patient notified that their request is being sent to the clinical staff for review and that they should receive a response within 2 business days.   Please advise at Mobile [phone] (mobile)

## 2023-02-21 ENCOUNTER — Telehealth: Payer: Self-pay | Admitting: Family Medicine

## 2023-02-21 NOTE — Telephone Encounter (Signed)
PA for Erenumab-aooe (AIMOVIG) 140 MG/ML SOAJ missing clinical info, office visits, labs  asking that it be faxed  [phone]

## 2023-02-21 NOTE — Telephone Encounter (Signed)
Notes faxed.

## 2023-02-22 DIAGNOSIS — G43909 Migraine, unspecified, not intractable, without status migrainosus: Secondary | ICD-10-CM | POA: Diagnosis not present

## 2023-02-22 DIAGNOSIS — M9902 Segmental and somatic dysfunction of thoracic region: Secondary | ICD-10-CM | POA: Diagnosis not present

## 2023-02-22 DIAGNOSIS — M25619 Stiffness of unspecified shoulder, not elsewhere classified: Secondary | ICD-10-CM | POA: Diagnosis not present

## 2023-02-22 DIAGNOSIS — M9901 Segmental and somatic dysfunction of cervical region: Secondary | ICD-10-CM | POA: Diagnosis not present

## 2023-02-22 DIAGNOSIS — M7918 Myalgia, other site: Secondary | ICD-10-CM | POA: Diagnosis not present

## 2023-02-22 DIAGNOSIS — M5412 Radiculopathy, cervical region: Secondary | ICD-10-CM | POA: Diagnosis not present

## 2023-02-25 NOTE — Telephone Encounter (Signed)
PA for Amovig approved. Patient aware

## 2023-03-04 DIAGNOSIS — M25561 Pain in right knee: Secondary | ICD-10-CM | POA: Diagnosis not present

## 2023-03-12 DIAGNOSIS — S83281A Other tear of lateral meniscus, current injury, right knee, initial encounter: Secondary | ICD-10-CM | POA: Diagnosis not present

## 2023-03-14 DIAGNOSIS — M25619 Stiffness of unspecified shoulder, not elsewhere classified: Secondary | ICD-10-CM | POA: Diagnosis not present

## 2023-03-14 DIAGNOSIS — G43909 Migraine, unspecified, not intractable, without status migrainosus: Secondary | ICD-10-CM | POA: Diagnosis not present

## 2023-03-14 DIAGNOSIS — M9902 Segmental and somatic dysfunction of thoracic region: Secondary | ICD-10-CM | POA: Diagnosis not present

## 2023-03-14 DIAGNOSIS — M9901 Segmental and somatic dysfunction of cervical region: Secondary | ICD-10-CM | POA: Diagnosis not present

## 2023-03-14 DIAGNOSIS — M7918 Myalgia, other site: Secondary | ICD-10-CM | POA: Diagnosis not present

## 2023-03-14 DIAGNOSIS — M5412 Radiculopathy, cervical region: Secondary | ICD-10-CM | POA: Diagnosis not present

## 2023-04-01 ENCOUNTER — Encounter: Payer: Self-pay | Admitting: Family Medicine

## 2023-04-01 MED ORDER — UBRELVY 50 MG PO TABS: ORAL_TABLET | ORAL | 4 refills | Status: DC

## 2023-04-11 ENCOUNTER — Other Ambulatory Visit: Payer: Self-pay | Admitting: Family Medicine

## 2023-04-12 MED ORDER — ACETAZOLAMIDE 125 MG PO TABS: ORAL_TABLET | ORAL | 0 refills | Status: DC

## 2023-04-21 ENCOUNTER — Other Ambulatory Visit: Payer: Self-pay | Admitting: Family Medicine

## 2023-04-29 ENCOUNTER — Telehealth: Payer: Self-pay

## 2023-04-29 ENCOUNTER — Other Ambulatory Visit (HOSPITAL_COMMUNITY): Payer: Self-pay

## 2023-04-29 ENCOUNTER — Telehealth: Payer: Self-pay | Admitting: Family Medicine

## 2023-04-29 NOTE — Telephone Encounter (Signed)
Pharmacy Patient Advocate Encounter   Received notification from [employer] that prior authorization for Aimovig is required/requested.   PA submitted to  [employer]  via Fax [phone])  Status is pending  For follow up: [phone]

## 2023-04-29 NOTE — Telephone Encounter (Signed)
PA submitted via fax. Created new encounter for PA. Will route back to pool once determination has been made.  

## 2023-04-29 NOTE — Telephone Encounter (Signed)
Pt is calling and need PA on Erenumab-aooe (AIMOVIG) 140 MG/ML SOAJ . Pt has new insurance in chart

## 2023-05-06 ENCOUNTER — Telehealth: Payer: Self-pay | Admitting: Family Medicine

## 2023-05-06 NOTE — Telephone Encounter (Signed)
Pharmacy Patient Advocate Encounter  Received notification from  [employer]  that Prior Authorization for Aimovig has been DENIED because the member has not demonstrated an inadequate response after 12 weeks of continuous therapy to divalproex or topiramate AND at least one medication in drug classes: beta blocker or tricyclic antidpressent. If allergy, intolerance, or contraindication must fail ALL: divalproex, topiramate, and at least one medication from BOTH drug classes: beta blocker and tricyclic antidepressant..        Please be advised we currently do not have a Pharmacist to review denials, therefore you will need to process appeals accordingly as needed. Thanks for your support at this time. Contact for appeals are as follows: Phone: [phone] Ext 3, Fax: [phone]  Denial letter attached to chart

## 2023-05-06 NOTE — Telephone Encounter (Signed)
 Please see other encounter.

## 2023-05-06 NOTE — Telephone Encounter (Signed)
Pt is returning Mykal's call. ?

## 2023-05-06 NOTE — Telephone Encounter (Signed)
 Left message for the patient to return my call.

## 2023-05-09 NOTE — Telephone Encounter (Signed)
I spoke with the patient and she requested an appeal be done in regards to her medication. An appeal has been completed and faxed to [employer] at [phone]

## 2023-05-14 MED ORDER — PROPRANOLOL HCL ER 80 MG PO CP24: 80.0000 mg | ORAL_CAPSULE | Freq: Every day | ORAL | 1 refills | Status: DC

## 2023-05-14 NOTE — Telephone Encounter (Signed)
Rx sent and patient informed of message below and voiced understanding

## 2023-05-14 NOTE — Addendum Note (Signed)
Addended by: Christy Sartorius on: 05/14/2023 02:55 PM   Modules accepted: Orders

## 2023-05-14 NOTE — Telephone Encounter (Signed)
Fax was received on decision for PA and Aimovig was denied due to the patient not having tried Propanolol for at least 12 weeks. Patient would like to try Propanolol to satisfy insurance requirements. Please see denial letter for additional information.

## 2023-06-10 ENCOUNTER — Ambulatory Visit (INDEPENDENT_AMBULATORY_CARE_PROVIDER_SITE_OTHER): Payer: Self-pay | Admitting: Plastic Surgery

## 2023-06-10 DIAGNOSIS — Z719 Counseling, unspecified: Secondary | ICD-10-CM

## 2023-06-10 NOTE — Progress Notes (Signed)

## 2023-06-19 ENCOUNTER — Other Ambulatory Visit (HOSPITAL_COMMUNITY): Payer: Self-pay

## 2023-07-04 ENCOUNTER — Telehealth: Payer: Self-pay

## 2023-07-04 ENCOUNTER — Other Ambulatory Visit (HOSPITAL_COMMUNITY): Payer: Self-pay

## 2023-07-04 NOTE — Telephone Encounter (Signed)
Pharmacy Patient Advocate Encounter   Received notification from CoverMyMeds that prior authorization for Ubrelvy 50mg  is required/requested.   Insurance verification completed.   The patient is insured through  [place]  .   Per test claim: PA required; PA submitted to [facility] via Fax Key/confirmation #[id number] Status is pending   ID# [id number] PH# [phone] FAX# [phone]

## 2023-07-05 NOTE — Telephone Encounter (Signed)
Pharmacy Patient Advocate Encounter  Received notification from [employer] that Prior Authorization for Bernita Raisin has been DENIED.  Full denial letter will be uploaded to the media tab. See denial reason below.   PA #/Case ID/Reference #: [id number]

## 2023-07-09 NOTE — Telephone Encounter (Signed)
Patient informed of denial. Patient reported that she is coming up on taking Propanolol 80 mg for 90 days and prescription is not helping with migraine relief. Authorization will be sent to insurance for approval or denial

## 2023-07-15 NOTE — Telephone Encounter (Signed)
I spoke with the patient and she reported she has tried Sumatriptan. I will contact pharmacy for prior authorization for Ubrevly 50 mg

## 2023-07-16 NOTE — Telephone Encounter (Signed)
I spoke with Elizabeth Washington with [employer] and she reported that the patient needs to try covered alternative and we cannot process new authorization request for at least 6 months. Patient has tried Sumatriptan but not Imitrex injection. Patient inquired on how she will take Imitrex injection and the frequency of injections? Patient also inquired if injection will have interaction with Aimovig 140 mg.

## 2023-07-17 MED ORDER — PROPRANOLOL HCL ER 120 MG PO CP24: 120.0000 mg | ORAL_CAPSULE | Freq: Every day | ORAL | 0 refills | Status: DC

## 2023-07-17 MED ORDER — ELETRIPTAN HYDROBROMIDE 40 MG PO TABS: ORAL_TABLET | ORAL | 0 refills | Status: DC

## 2023-07-17 NOTE — Addendum Note (Signed)
Addended by: Christy Sartorius on: 07/17/2023 11:17 AM   Modules accepted: Orders

## 2023-07-17 NOTE — Telephone Encounter (Signed)
Left message for the patient to return my call.

## 2023-07-17 NOTE — Addendum Note (Signed)
Addended by: Christy Sartorius on: 07/17/2023 11:32 AM   Modules accepted: Orders

## 2023-07-17 NOTE — Telephone Encounter (Signed)
I spoke with the patient and she reported she has been tolerating Inderal 80 mg and rx was sent for both medications and OV has been scheduled

## 2023-08-16 ENCOUNTER — Ambulatory Visit: Payer: No Typology Code available for payment source | Admitting: Family Medicine

## 2023-08-16 ENCOUNTER — Encounter: Payer: Self-pay | Admitting: Family Medicine

## 2023-08-16 DIAGNOSIS — G43011 Migraine without aura, intractable, with status migrainosus: Secondary | ICD-10-CM | POA: Diagnosis not present

## 2023-08-16 MED ORDER — AIMOVIG 140 MG/ML ~~LOC~~ SOAJ: 140.0000 mg | SUBCUTANEOUS | 11 refills | Status: DC

## 2023-08-16 MED ORDER — PROPRANOLOL HCL ER 80 MG PO CP24
80.0000 mg | ORAL_CAPSULE | Freq: Every day | ORAL | 1 refills | Status: DC
Start: 2023-08-16 — End: 2023-10-31

## 2023-08-16 NOTE — Progress Notes (Signed)
Established Patient Office Visit  Subjective   Patient ID: ARNELLA GULDEN, female    DOB: [birth year]  Age: 37 y.o. MRN: [id number]  Chief Complaint  Patient presents with   Medication Consultation    HPI   Belenda Cruise is here today to discuss migraine headaches.  She has longstanding history of migraines.  These seem to cluster around her menses but she has usually 6 to 7 episodes per month of migraine headache.  She has tried multiple acute medications in the past including Motrin, Phenergan, Maxalt, and Imitrex with minimal relief.  Recently I taken Bernita Raisin which seemed to help but she had difficulty getting this covered by insurance.  She has had some success with Relpax.  Her migraines greatly interfere with her daily life and ability to work.  She has had a significant amount of loss time to productive activities because of her headaches.  Previously took Aimovig which worked extremely well until total coverage was rejected.  She previously took Topamax for over 12 weeks continuous therapy at full prophylactic dose but had some dizziness.  We more recently tried Inderal 12 weeks continuously but she has had significant side effects including fatigue, insomnia, and brain fog.  Also, Benadryl did not seem to do a very good job preventing her migraines.  She was still having 6 to 7/month.  She is usually very consistent with sleep.  Not aware of any specific food triggers.  Past Medical History:  Diagnosis Date   Kidney stone    Migraine    Miscarriage, threatened, early pregnancy 11/28/2014   Normal intrauterine pregnancy in third trimester 02/10/2016   SVD (spontaneous vaginal delivery) 02/11/2016   SVD (spontaneous vaginal delivery) 07/14/2017   Past Surgical History:  Procedure Laterality Date   KNEE SURGERY  2008, 2009, 2010   x3    reports that she has never smoked. She has never used smokeless tobacco. She reports current alcohol use. She reports that she does not use  drugs. family history includes Arthritis in her mother; Cancer in her unknown relative; Heart disease in her father; Hyperlipidemia in her father. No Known Allergies  Review of Systems  Constitutional:  Negative for chills and fever.  Respiratory:  Negative for shortness of breath.   Cardiovascular:  Negative for chest pain.  Neurological:  Positive for headaches.      Objective:     BP 104/70 (BP Location: Left Arm, Patient Position: Sitting, Cuff Size: Normal)   Pulse 64   Temp (!) 97.4 F (36.3 C) (Oral)   Ht 5\' 8"  (1.727 m)   Wt 154 lb 4.8 oz (70 kg)   SpO2 99%   BMI 23.46 kg/m  BP Readings from Last 3 Encounters:  08/16/23 104/70  11/12/22 120/80  11/02/22 120/80   Wt Readings from Last 3 Encounters:  08/16/23 154 lb 4.8 oz (70 kg)  11/12/22 149 lb 9.6 oz (67.9 kg)  11/02/22 151 lb 11.2 oz (68.8 kg)      Physical Exam Vitals reviewed.  Constitutional:      Appearance: Normal appearance.  Cardiovascular:     Rate and Rhythm: Normal rate and regular rhythm.     Heart sounds: No murmur heard. Pulmonary:     Effort: Pulmonary effort is normal.     Breath sounds: Normal breath sounds. No wheezing or rales.  Neurological:     General: No focal deficit present.     Mental Status: She is alert.      No results  found for any visits on 08/16/23.    The ASCVD Risk score (Arnett DK, et al., 2019) failed to calculate for the following reasons:   The 2019 ASCVD risk score is only valid for ages 10 to 10    Assessment & Plan:   Problem List Items Addressed This Visit       Unprioritized   Migraine headache without aura   Relevant Medications   propranolol ER (INDERAL LA) 80 MG 24 hr capsule   Erenumab-aooe (AIMOVIG) 140 MG/ML SOAJ  Longstanding history of migraine headaches.  She is having generally about 6-7 migraines per month.  She took over 12 weeks of continuous therapy with both Topamax and beta-blocker with Inderal and has side effects from both  medication.  Additionally, she has had ongoing frequent breakthrough headaches with prophylaxis above.  She has responded extremely well in the past to Aimovig and we would again try to get this covered for her.  No follow-ups on file.    Evelena Peat, MD

## 2023-09-03 ENCOUNTER — Ambulatory Visit (INDEPENDENT_AMBULATORY_CARE_PROVIDER_SITE_OTHER): Payer: No Typology Code available for payment source | Admitting: Plastic Surgery

## 2023-09-03 DIAGNOSIS — D229 Melanocytic nevi, unspecified: Secondary | ICD-10-CM | POA: Insufficient documentation

## 2023-09-03 NOTE — Progress Notes (Signed)
Preoperative Dx: flesh nevus on face x 3  Postoperative Dx:  same  Procedure: laser to nevus x 3 on face   Anesthesia: none  Description of Procedure:  Risks and complications were explained to the patient. Consent was confirmed and signed. Eye protection was placed. Time out was called and all information was confirmed to be correct. The area  area was prepped with alcohol and wiped dry. The TRL laser was set at 15. The lesions were lasered. The patient tolerated the procedure well and there were no complications. The patient is to follow up in 4 weeks.

## 2023-09-16 ENCOUNTER — Other Ambulatory Visit (HOSPITAL_COMMUNITY): Payer: Self-pay

## 2023-09-16 ENCOUNTER — Telehealth: Payer: Self-pay | Admitting: Family Medicine

## 2023-09-16 ENCOUNTER — Telehealth: Payer: Self-pay

## 2023-09-16 NOTE — Telephone Encounter (Signed)
Noted  

## 2023-09-16 NOTE — Telephone Encounter (Signed)
Pharmacy Patient Advocate Encounter   Received notification from Pt Calls Messages that prior authorization for AIMOVIG is required/requested.   Insurance verification completed.   The patient is insured through  [facility]  .   Per test claim: PA REQUIRED. Called insurance to start PA. Waiting to receive PA form

## 2023-09-16 NOTE — Telephone Encounter (Signed)
Pt is calling and seen dr Caryl Never on 08-15-2024 and she has tried propranolol ER (INDERAL LA) 80 MG 24 hr capsule  for migraines and she is checking on status of PA for Erenumab-aooe (AIMOVIG) 140 MG/ML [id number] - [place], [place] - [address] AT [facility] OF [address] Phone: [phone]  Fax: [phone]

## 2023-09-16 NOTE — Telephone Encounter (Signed)
Called insurance to start PA. Waiting to receive PA form to complete.

## 2023-09-17 NOTE — Telephone Encounter (Addendum)
Never received PA form. Called insurance again.   Phone #: [phone]

## 2023-09-18 NOTE — Telephone Encounter (Signed)
PA form and chart notes faxed to [phone]

## 2023-09-24 ENCOUNTER — Other Ambulatory Visit (HOSPITAL_COMMUNITY): Payer: Self-pay

## 2023-09-24 NOTE — Telephone Encounter (Signed)
Pharmacy Patient Advocate Encounter  Received notification that Prior Authorization for AIMOVIG has been APPROVED from 09/18/23 to 03/18/24   PA #/Case ID/Reference #: [id number]   Called insurance to check status, informed that PA has been approved.

## 2023-09-25 NOTE — Telephone Encounter (Signed)
Pharmacy informed.

## 2023-10-05 ENCOUNTER — Ambulatory Visit (INDEPENDENT_AMBULATORY_CARE_PROVIDER_SITE_OTHER): Payer: No Typology Code available for payment source | Admitting: Plastic Surgery

## 2023-10-05 DIAGNOSIS — Z719 Counseling, unspecified: Secondary | ICD-10-CM

## 2023-10-05 NOTE — Progress Notes (Signed)
Preoperative Dx: skin nodules  Postoperative Dx:  same  Procedure: laser to forehead and left cheek   Anesthesia: none  Description of Procedure:  Risks and complications were explained to the patient. Consent was confirmed and signed. Eye protection was placed. Time out was called and all information was confirmed to be correct. The area  area was prepped with alcohol and wiped dry. The TRL laser was set at 25. The lesions were lasered. The patient tolerated the procedure well and there were no complications. The patient is to follow up in 4 weeks.

## 2023-10-10 ENCOUNTER — Other Ambulatory Visit: Payer: Self-pay | Admitting: Family Medicine

## 2023-10-11 MED ORDER — ACETAZOLAMIDE 125 MG PO TABS: ORAL_TABLET | ORAL | 0 refills | Status: DC

## 2023-10-19 ENCOUNTER — Other Ambulatory Visit: Payer: Self-pay | Admitting: Family Medicine

## 2023-10-31 ENCOUNTER — Other Ambulatory Visit: Payer: Self-pay

## 2023-10-31 MED ORDER — PROPRANOLOL HCL ER 80 MG PO CP24: 80.0000 mg | ORAL_CAPSULE | Freq: Every day | ORAL | 1 refills | Status: DC

## 2023-11-05 ENCOUNTER — Ambulatory Visit (INDEPENDENT_AMBULATORY_CARE_PROVIDER_SITE_OTHER): Payer: Self-pay | Admitting: Plastic Surgery

## 2023-11-05 ENCOUNTER — Encounter: Payer: Self-pay | Admitting: Plastic Surgery

## 2023-11-05 DIAGNOSIS — Z719 Counseling, unspecified: Secondary | ICD-10-CM

## 2023-11-05 NOTE — Progress Notes (Signed)
 Preoperative Dx: skin lesions  Postoperative Dx:  same  Procedure: laser to four skin lesions   Anesthesia: none  Description of Procedure:  Risks and complications were explained to the patient. Consent was confirmed and signed. Eye protection was placed. Time out was called and all information was confirmed to be correct. The area  area was prepped with alcohol and wiped dry. The TRL laser was set at 25.  The lesions were lasered. The patient tolerated the procedure well and there were no complications. The patient is to follow up in 4 weeks.

## 2023-11-07 ENCOUNTER — Encounter: Payer: Self-pay | Admitting: Family Medicine

## 2023-11-11 ENCOUNTER — Encounter: Payer: Self-pay | Admitting: Family Medicine

## 2023-11-11 ENCOUNTER — Telehealth (INDEPENDENT_AMBULATORY_CARE_PROVIDER_SITE_OTHER): Payer: No Typology Code available for payment source | Admitting: Family Medicine

## 2023-11-11 VITALS — Ht 68.0 in

## 2023-11-11 DIAGNOSIS — R519 Headache, unspecified: Secondary | ICD-10-CM

## 2023-11-11 DIAGNOSIS — R112 Nausea with vomiting, unspecified: Secondary | ICD-10-CM

## 2023-11-11 DIAGNOSIS — J069 Acute upper respiratory infection, unspecified: Secondary | ICD-10-CM

## 2023-11-11 MED ORDER — BENZONATATE 100 MG PO CAPS: 200.0000 mg | ORAL_CAPSULE | Freq: Two times a day (BID) | ORAL | 0 refills | Status: AC | PRN

## 2023-11-11 MED ORDER — ONDANSETRON HCL 4 MG PO TABS: 4.0000 mg | ORAL_TABLET | Freq: Three times a day (TID) | ORAL | 0 refills | Status: AC | PRN

## 2023-11-11 NOTE — Progress Notes (Signed)
Virtual Visit via Video Note I connected with Elizabeth Washington on 11/11/2023 by a video enabled telemedicine application and verified that I am speaking with the correct person using two identifiers. Location patient: home Location provider: work office Persons participating in the virtual visit: patient, scribe, provider  I discussed the limitations of evaluation and management by telemedicine and the availability of in person appointments. The patient expressed understanding and agreed to proceed.  Chief Complaint  Patient presents with   covid/flu   HPI: Ms. Elizabeth Brackens. Washington is a 38 y.o. female with a PMHx significant for migraine headache without aura, who is being seen on video today for 5 days of respiratory and GI symptoms.   Patient complains of fatigue, subjective fever, myalgias, cough, nasal, congestion, rhinorrhea, intermittent headache, nausea, vomiting, diarrhea, loss of appetite, and lightheadedness while standing since 11/05/2022.  She endorses some wheezing with force expiration and abdominal wall pain with her cough.  "Scratchy" throat when symptoms first started, now just with coughing. Her headache will occasionally worsen significantly.   Nausea and vomiting aggravated by food intake. She vomited 3x yesterday and twice today, most recently half an hour ago.  Today she has been able to hold food down.  Diarrhea: She has had 3 stools today and had 3-4 yesterday.  She has not noted mucus or blood in the stool.  She was seen in UC for her symptoms on 1/16, a day after onset, and tested negative for Covid and Flu. She hasn't rechecked since then.   Negative for visual changes, neck pain, CP, SOB, abdominal pain,urinary symptoms, skin rash, or focal weakness. No known sick contacts.   ROS: See pertinent positives and negatives per HPI.  Past Medical History:  Diagnosis Date   Kidney stone    Migraine    Miscarriage, threatened, early pregnancy 11/28/2014   Normal  intrauterine pregnancy in third trimester 02/10/2016   SVD (spontaneous vaginal delivery) 02/11/2016   SVD (spontaneous vaginal delivery) 07/14/2017    Past Surgical History:  Procedure Laterality Date   KNEE SURGERY  2008, 2009, 2010   x3    Family History  Problem Relation Age of Onset   Arthritis Mother    Hyperlipidemia Father    Heart disease Father    Cancer Unknown        prostate/grandfather    Social History   Socioeconomic History   Marital status: Married    Spouse name: Not on file   Number of children: 0   Years of education: 12   Highest education level: Master's degree (e.g., MA, MS, MEng, MEd, MSW, MBA)  Occupational History    Employer: [employer]  Tobacco Use   Smoking status: Never   Smokeless tobacco: Never  Substance and Sexual Activity   Alcohol use: Yes    Comment: rare   Drug use: No   Sexual activity: Yes  Other Topics Concern   Not on file  Social History Narrative   Patient lives at home with Husband Elizabeth Washington.   Patient has no children    Patient works at the [employer].    Patient has a Dispensing optician Strain: Medium Risk (08/15/2023)   Overall Financial Resource Strain (CARDIA)    Difficulty of Paying Living Expenses: Somewhat hard  Food Insecurity: No Food Insecurity (08/15/2023)   Hunger Vital Sign    Worried About Running Out of Food  in the Last Year: Never true    Ran Out of Food in the Last Year: Never true  Transportation Needs: No Transportation Needs (08/15/2023)   PRAPARE - Administrator, Civil Service (Medical): No    Lack of Transportation (Non-Medical): No  Physical Activity: Sufficiently Active (08/15/2023)   Exercise Vital Sign    Days of Exercise per Week: 4 days    Minutes of Exercise per Session: 60 min  Stress: Stress Concern Present (08/15/2023)   [employer] of Occupational Health - Occupational Stress Questionnaire     Feeling of Stress : To some extent  Social Connections: Moderately Integrated (08/15/2023)   Social Connection and Isolation Panel [NHANES]    Frequency of Communication with Friends and Family: Three times a week    Frequency of Social Gatherings with Friends and Family: Twice a week    Attends Religious Services: Never    Database administrator or Organizations: Yes    Attends Engineer, structural: More than 4 times per year    Marital Status: Married  Catering manager Violence: Unknown (08/09/2022)   Received from [employer], [facility]   HITS    Physically Hurt: Not on file    Insult or Talk Down To: Not on file    Threaten Physical Harm: Not on file    Scream or Curse: Not on file     Current Outpatient Medications:    acetaminophen (TYLENOL) 500 MG tablet, Take 1,000 mg by mouth every 6 (six) hours as needed for mild pain, moderate pain, fever or headache., Disp: , Rfl:    acetaZOLAMIDE (DIAMOX) 125 MG tablet, Take 1 tablet by mouth twice daily starting 24 hours before ascent and discontinue 2 to 3 days after peak arrival or upon descent, Disp: 20 tablet, Rfl: 0   benzonatate (TESSALON) 100 MG capsule, Take 2 capsules (200 mg total) by mouth 2 (two) times daily as needed for up to 10 days., Disp: 30 capsule, Rfl: 0   eletriptan (RELPAX) 40 MG tablet, Take one at onset of migraine and may repeat once in 2 hours (max of 2 in 24 hours)., Disp: 10 tablet, Rfl: 0   Erenumab-aooe (AIMOVIG) 140 MG/ML SOAJ, Inject 140 mg into the skin every 30 (thirty) days., Disp: 1.12 mL, Rfl: 11   ibuprofen (ADVIL,MOTRIN) 800 MG tablet, Take 1 tablet (800 mg total) by mouth every 8 (eight) hours as needed., Disp: 45 tablet, Rfl: 1   ondansetron (ZOFRAN) 4 MG tablet, Take 1 tablet (4 mg total) by mouth every 8 (eight) hours as needed for up to 3 days for nausea or vomiting., Disp: 9 tablet, Rfl: 0   promethazine (PHENERGAN) 25 MG tablet, Take 1 tablet (25 mg total) by mouth once for 1 dose.,  Disp: 1 tablet, Rfl: 0  EXAM:  VITALS per patient if applicable:Ht 5\' 8"  (1.727 m)   LMP 11/10/2023   BMI 23.46 kg/m   GENERAL: alert, oriented, appears in no acute distress, she is lying down.  HEENT: atraumatic, conjunctiva clear, no obvious abnormalities on inspection of external nose and ears Nasal congestion.  NECK: normal movements of the head and neck. She is able to flex neck, no limitations or stiffness.  LUNGS: on inspection no signs of respiratory distress, breathing rate appears normal, no obvious gross SOB, gasping or wheezing Coughed a couple times during visit.  CV: no obvious cyanosis  MS: moves all visible extremities without noticeable abnormality  PSYCH/NEURO: pleasant and cooperative, no  obvious depression or anxiety, speech and thought processing grossly intact  ASSESSMENT AND PLAN:  Discussed the following assessment and plan:  URI, acute Symptoms suggests a viral etiology (respiratory and GI symptoms), so symptomatic treatment recommended. Day 1 she was negative for for flu and COVID 19, she did not re-check for COVID 19. Recommend monitoring temperature. Adequate hydration and rest. Benzonatate for cough management. Continue plain Mucinex. Check temp. I also explained that cough and nasal congestion can last a few days and sometimes weeks. States that she does not need a note for work. F/U as needed.  -     Benzonatate; Take 2 capsules (200 mg total) by mouth 2 (two) times daily as needed for up to 10 days.  Dispense: 30 capsule; Refill: 0  Nausea and vomiting in adult With associated diarrhea. Small sips of clear fluids throughout the day. Zofran 4 mg 3 times daily as needed recommended. Advance diet as tolerated.  -     Ondansetron HCl; Take 1 tablet (4 mg total) by mouth every 8 (eight) hours as needed for up to 3 days for nausea or vomiting.  Dispense: 9 tablet; Refill: 0  Headache, unspecified headache type  Described as severe at  time. Difficult to perform a thorough  neuro exam on video but hx does not suggest a serious process + negative for nuchal rigidity, normal cervical flexion on examination, and no focal weakness appreciated. Most likely related to viral illness, so should greatly improved in the next couple days. She was clearly instructed about warning signs, if severe get suddenly worse she needs to seek immediate medical attention.  We discussed possible serious and likely etiologies, options for evaluation and workup, limitations of telemedicine visit vs in person visit, treatment, treatment risks and precautions. The patient was advised to call back or seek an in-person evaluation if the symptoms worsen or if the condition fails to improve as anticipated. I discussed the assessment and treatment plan with the patient. The patient was provided an opportunity to ask questions and all were answered. The patient agreed with the plan and demonstrated an understanding of the instructions.  Return if symptoms worsen or fail to improve.  I, Rolla Etienne Wierda, acting as a scribe for Andrena Margerum [place], MD., have documented all relevant documentation on the behalf of Sindy Mccune [place], MD, as directed by  Dalila Arca [place], MD while in the presence of Maeghan Canny [place], MD.   I, Gibbs Naugle [place], MD, have reviewed all documentation for this visit. The documentation on 11/11/23 for the exam, diagnosis, procedures, and orders are all accurate and complete.  Jamil Armwood [place], MD

## 2023-11-23 ENCOUNTER — Other Ambulatory Visit: Payer: Self-pay | Admitting: Family Medicine

## 2023-12-03 ENCOUNTER — Ambulatory Visit (INDEPENDENT_AMBULATORY_CARE_PROVIDER_SITE_OTHER): Payer: Self-pay | Admitting: Plastic Surgery

## 2023-12-03 DIAGNOSIS — L989 Disorder of the skin and subcutaneous tissue, unspecified: Secondary | ICD-10-CM | POA: Insufficient documentation

## 2023-12-03 NOTE — Progress Notes (Signed)
Patient would like to have areas excised as they are not improving with laser.

## 2024-01-28 ENCOUNTER — Other Ambulatory Visit (HOSPITAL_COMMUNITY)
Admission: RE | Admit: 2024-01-28 | Discharge: 2024-01-28 | Disposition: A | Discharge status: Home / Self Care | Source: Ambulatory Visit | Attending: Plastic Surgery | Admitting: Plastic Surgery

## 2024-01-28 ENCOUNTER — Ambulatory Visit: Payer: No Typology Code available for payment source | Admitting: Plastic Surgery

## 2024-01-28 ENCOUNTER — Encounter: Payer: Self-pay | Admitting: Plastic Surgery

## 2024-01-28 VITALS — BP 103/67 | HR 63

## 2024-01-28 DIAGNOSIS — L989 Disorder of the skin and subcutaneous tissue, unspecified: Secondary | ICD-10-CM | POA: Insufficient documentation

## 2024-01-28 DIAGNOSIS — D2239 Melanocytic nevi of other parts of face: Secondary | ICD-10-CM

## 2024-01-28 NOTE — Progress Notes (Signed)
 Procedure Note  Preoperative Dx: changing skin lesion forehead, midline and right lateral  Postoperative Dx: Same  Procedure: Excision of changing skin lesion forehead Midline 4 mm Right lateral 4 mm  Anesthesia: Lidocaine 1% with 1:100,000 epinephrine  Indication for Procedure: changing skin lesion  Description of Procedure: Risks and complications were explained to the patient.  Consent was confirmed and the patient understands the risks and benefits.  The potential complications and alternatives were explained and the patient consents.  The patient expressed understanding the option of not having the procedure and the risks of a scar.  Time out was called and all information was confirmed to be correct.    Right lateral forehead: The area was prepped and drapped.  Lidocaine 1% with epinephrine was injected in the subcutaneous area.  After waiting several minutes for the local to take affect a #15 blade was used to excise the area.  The skin edges were reapproximated with 6-0 Monocryl.  Midline forehead: The area was prepped and drapped.  Lidocaine 1% with epinephrine was injected in the subcutaneous area.  After waiting several minutes for the local to take affect a #15 blade was used to excise the area.  The skin edges were reapproximated with 6-0 Monocryl.  A dressing was applied.  The patient was given instructions on how to care for the area and a follow up appointment.  Elizabeth Washington tolerated the procedure well and there were no complications. The specimens were sent to pathology.

## 2024-01-31 LAB — SURGICAL PATHOLOGY

## 2024-02-05 ENCOUNTER — Ambulatory Visit: Admitting: Physician Assistant

## 2024-02-05 VITALS — BP 117/82 | HR 61

## 2024-02-05 DIAGNOSIS — Z719 Counseling, unspecified: Secondary | ICD-10-CM

## 2024-02-05 DIAGNOSIS — D2330 Other benign neoplasm of skin of unspecified part of face: Secondary | ICD-10-CM

## 2024-02-05 NOTE — Progress Notes (Signed)
 Patient is a pleasant 38 year old female s/p excision of changing skin lesion x 2 on the forehead performed 01/28/2024 by Dr. Orin Birk who presents to clinic for postprocedural follow-up.  Reviewed procedure note and skin excisions were reapproximated with 6-0 Monocryl.  Each of the specimen sent to pathology were consistent with intradermal nevus, no atypia.  Peripheral margin involvement.  Today, patient is doing well.  No specific concerns or complaints.  Steri-Strips remain in place.  On exam, Steri-Strips gently removed without complication or difficulty.  Skin edges well-approximated.  A single 6-0 interrupted Monocryl sutures removed from each excision site, well-tolerated by patient.  No incisional wound or drainage.  Surrounding skin and tissue appears healthy.  Recommending Vaseline for the next 3 days and then transition to silicone scar gel twice daily x 3 months.  Suspect that she will have minimal scarring as she has excellent cosmesis today and these were very small excisions.  Avoid UV exposure, use sunscreen or hats.  Discussed pathology.  Patient expressed understanding.  Follow-up only as needed.

## 2024-03-03 ENCOUNTER — Other Ambulatory Visit (HOSPITAL_COMMUNITY): Payer: Self-pay

## 2024-03-03 ENCOUNTER — Encounter: Payer: Self-pay | Admitting: Family Medicine

## 2024-03-03 ENCOUNTER — Telehealth: Payer: Self-pay

## 2024-03-03 NOTE — Telephone Encounter (Signed)
 Patient reported she will send insurance information via Mychart

## 2024-03-03 NOTE — Telephone Encounter (Signed)
 Copied from CRM [id number]. Topic: Clinical - Medication Question >> Mar 03, 2024  8:03 AM Elizabeth Washington wrote: Reason for CRM: Patient called to speak with nurse or dr regarding her medication Erenumab -aooe (AIMOVIG ) 140 MG/ML SOAJ states her insurance company has been reaching out to the provider with no response, now her medication is being denied. [employer].[id number] pt is requesting the provider contact her insurance to get a prior auth for the medication  Please call pt back with and updates. [phone]

## 2024-03-04 ENCOUNTER — Other Ambulatory Visit (HOSPITAL_COMMUNITY): Payer: Self-pay

## 2024-03-04 ENCOUNTER — Telehealth: Payer: Self-pay

## 2024-03-04 NOTE — Telephone Encounter (Signed)
 Pharmacy Patient Advocate Encounter   Received notification from Patient Advice Request messages that prior authorization for Erenumab -aooe (AIMOVIG ) 140 MG/ML SOAJ  is required/requested.   Insurance verification completed.   The patient is insured through [place] .   Per test claim: Refill too soon. PA is not needed at this time. Medication was filled Not provided. Next eligible fill date is 03/16/2024.

## 2024-03-04 NOTE — Telephone Encounter (Signed)
 Per test claim: Refill too soon. PA is not needed at this time. Medication was filled Unknown. Next eligible fill date is 03/16/2024.

## 2024-03-11 ENCOUNTER — Ambulatory Visit (INDEPENDENT_AMBULATORY_CARE_PROVIDER_SITE_OTHER): Payer: Self-pay | Admitting: Surgical

## 2024-03-11 ENCOUNTER — Encounter: Payer: Self-pay | Admitting: Surgical

## 2024-03-11 DIAGNOSIS — Z719 Counseling, unspecified: Secondary | ICD-10-CM

## 2024-03-11 NOTE — Progress Notes (Signed)
 Botulinum Toxin Procedure Note  Procedure: Cosmetic botulinum toxin  Pre-operative Diagnosis: Dynamic rhytides  Post-operative Diagnosis: Same  Complications:  None  Brief history: The patient desires botulinum toxin injection.  She is aware of the risks including bleeding, damage to deeper structures, asymmetry, brow ptosis, eyelid ptosis, bruising. The patient understands and wishes to proceed.  Reviewed EMR, patient previously had 30 units of Botox.  She reports that 30 units works great for her, however last time she felt like she had a lot of lateral movement of her brow which she would like to avoid.   Procedure: The area was prepped with alcohol and dried with a clean gauze.  Using a clean technique the botulinum toxin was diluted with 2.5 mL of bacteriostatic saline per 100 unit vial which resulted in 4 units per 0.1 mL.  Subsequently the mixture was injected in the glabellar, lateral canthal lines, forehead area with preservation of the temporal branch to the lateral eyebrow. A total of 30 Units of botulinum toxin was used. The forehead and glabellar area was injected with care to inject intramuscular only while holding pressure on the supratrochlear vessels in each area during each injection on either side of the medial corrugators. The injection proceeded vertically superiorly to the medial 2/3 of the frontalis muscle and superior 2/3 of the lateral frontalis, again with preservation of the frontal branch.  No complications were noted. Light pressure was held for 5 minutes. She was instructed explicitly in post-operative care.  We injected the forehead in a standard and pattern within the injection centrally superiorly.  She has very symmetric forehead lines/forehead movement.  We injected the lateral canthal lines with 2 injections.  We also injected the glabellar in a standard pattern.  Botox LOT: [id number] EXP:  12/2025

## 2024-03-16 ENCOUNTER — Other Ambulatory Visit: Payer: Self-pay | Admitting: Family Medicine

## 2024-04-07 ENCOUNTER — Telehealth: Payer: Self-pay

## 2024-04-07 NOTE — Telephone Encounter (Signed)
 Copied from CRM [id number]. Topic: Clinical - Medication Prior Auth >> Apr 07, 2024  9:07 AM Juluis Ok wrote: Reason for CRM: Patient calling to inform provider that her insurance provider is sending a PA for Erenumab -aooe (AIMOVIG ) 140 MG/ML SOAJ.

## 2024-04-08 ENCOUNTER — Other Ambulatory Visit (HOSPITAL_COMMUNITY): Payer: Self-pay

## 2024-04-08 ENCOUNTER — Telehealth: Payer: Self-pay

## 2024-04-08 NOTE — Telephone Encounter (Signed)
 Noted

## 2024-04-08 NOTE — Telephone Encounter (Signed)
 Pharmacy Patient Advocate Encounter   Received notification from Pt Calls Messages that prior authorization for Aimovig  140 is required/requested.   Insurance verification completed.   The patient is insured through [email or web address] .   Per test claim: PA required; PA submitted to above mentioned insurance via Fax Key/confirmation #/EOC faxed to [id number] Status is pending

## 2024-04-16 ENCOUNTER — Other Ambulatory Visit (HOSPITAL_COMMUNITY): Payer: Self-pay

## 2024-04-21 ENCOUNTER — Other Ambulatory Visit (HOSPITAL_COMMUNITY): Payer: Self-pay

## 2024-04-21 NOTE — Telephone Encounter (Signed)
 Pharmacy Patient Advocate Encounter  Received notification from Miranda at [email or web address] that Prior Authorization for Aimovig  140 has been DENIED.  No reason given; No denial letter received via Fax or CMM. It has been requested and will be uploaded to the media tab once received.

## 2024-04-22 ENCOUNTER — Other Ambulatory Visit (HOSPITAL_COMMUNITY): Payer: Self-pay

## 2024-04-22 NOTE — Telephone Encounter (Signed)
 Noted

## 2024-04-27 NOTE — Telephone Encounter (Signed)
 Noted

## 2024-04-28 ENCOUNTER — Other Ambulatory Visit (HOSPITAL_COMMUNITY): Payer: Self-pay

## 2024-04-29 ENCOUNTER — Telehealth: Payer: Self-pay | Admitting: Pharmacist

## 2024-04-29 NOTE — Telephone Encounter (Addendum)
 Prior Authorization form/request asks a question that requires your assistance. Please see the question below and advise accordingly. The PA will not be submitted until the necessary information is received.      Per PA request, effectiveness questions must be accompanied with documentation from the medical record.

## 2024-04-29 NOTE — Telephone Encounter (Signed)
 Appeal has been Submitted. New Encounter has been or will be created for follow up. For additional info see Pharmacy Prior Auth telephone encounter from 04/29/24.

## 2024-04-29 NOTE — Telephone Encounter (Signed)
 Appeal has been submitted for Aimovig . Will advise when response is received, please be advised that most companies may take 30 days to make a decision. Information has been faxed to [phone] on 04/29/2024 @11 :10 am.  Thank you, Devere Pandy, PharmD Clinical Pharmacist    Direct Dial: [phone]

## 2024-04-29 NOTE — Telephone Encounter (Signed)
 Noted

## 2024-05-07 NOTE — Telephone Encounter (Signed)
 Insurance has approved the appeal for Aimovig , full letter can be found under the media tab of the patient's chart.    Thank you, Devere Pandy, PharmD Clinical Pharmacist  [place]  Direct Dial: [phone]

## 2024-05-08 NOTE — Telephone Encounter (Signed)
 Mychart message sent on 05/06/2024 informing patient of approval

## 2024-05-21 ENCOUNTER — Telehealth: Payer: Self-pay | Admitting: *Deleted

## 2024-05-21 ENCOUNTER — Telehealth: Payer: Self-pay | Admitting: Family Medicine

## 2024-05-21 NOTE — Telephone Encounter (Signed)
 Copied from CRM [id number]. Topic: General - Other >> May 21, 2024  9:20 AM Pinkey ORN wrote: Reason for CRM: Returning Office Call >> May 21, 2024  9:24 AM Pinkey ORN wrote: Patient is returning an office call back to Good, Mykal L, CMA  Patient is requesting that if unavailable to answer, to please leave the purpose of the call on voicemail.  Patient call back number is [phone]

## 2024-05-21 NOTE — Telephone Encounter (Signed)
 Left message for patient to return my call.

## 2024-05-21 NOTE — Telephone Encounter (Signed)
 Patient aware of approval for Aimovig  prescription

## 2024-05-21 NOTE — Telephone Encounter (Signed)
 Copied from CRM [id number]. Topic: General - Call Back - No Documentation >> May 20, 2024  4:59 PM Wess RAMAN wrote: Reason for CRM: Patient missed call from Good, Mykal L, CMA and would like a call back.  Callback #: [phone]

## 2024-05-21 NOTE — Telephone Encounter (Signed)
 Please see most recent note

## 2024-07-02 ENCOUNTER — Encounter: Payer: Self-pay | Admitting: Family Medicine

## 2024-07-02 MED ORDER — ELETRIPTAN HYDROBROMIDE 40 MG PO TABS: ORAL_TABLET | ORAL | 0 refills | Status: DC

## 2024-07-16 ENCOUNTER — Ambulatory Visit (INDEPENDENT_AMBULATORY_CARE_PROVIDER_SITE_OTHER): Payer: Self-pay | Admitting: Surgical

## 2024-07-16 ENCOUNTER — Encounter: Payer: Self-pay | Admitting: Surgical

## 2024-07-16 DIAGNOSIS — Z719 Counseling, unspecified: Secondary | ICD-10-CM

## 2024-07-16 NOTE — Progress Notes (Signed)
 Botulinum Toxin Procedure Note  Procedure: Cosmetic botulinum toxin  Pre-operative Diagnosis: Dynamic rhytides  Post-operative Diagnosis: Same  Complications:  None  Brief history: The patient desires botulinum toxin injection.  She is aware of the risks including bleeding, damage to deeper structures, asymmetry, brow ptosis, eyelid ptosis, bruising. The patient understands and wishes to proceed.  Procedure: The area was prepped with alcohol and dried with a clean gauze.  Using a clean technique the botulinum toxin was diluted with 2.5 mL of bacteriostatic saline per 100 unit vial which resulted in 4 units per 0.1 mL.  Subsequently the mixture was injected in the glabellar, lateral canthal lines, forehead area with preservation of the temporal branch to the lateral eyebrow. A total of 30 Units of botulinum toxin was used. The forehead and glabellar area was injected with care to inject intramuscular only while holding pressure on the supratrochlear vessels in each area during each injection on either side of the medial corrugators. The injection proceeded vertically superiorly to the medial 2/3 of the frontalis muscle and superior 2/3 of the lateral frontalis, again with preservation of the frontal branch.  No complications were noted. Light pressure was held. She was instructed explicitly in post-operative care.  Botox LOT:  [id number] EXP:  07/2026

## 2024-08-25 ENCOUNTER — Other Ambulatory Visit: Payer: Self-pay | Admitting: Family Medicine

## 2024-08-25 DIAGNOSIS — G43011 Migraine without aura, intractable, with status migrainosus: Secondary | ICD-10-CM

## 2024-09-11 ENCOUNTER — Encounter: Payer: Self-pay | Admitting: Family Medicine

## 2024-09-11 ENCOUNTER — Other Ambulatory Visit: Payer: Self-pay | Admitting: Family Medicine

## 2024-09-25 ENCOUNTER — Ambulatory Visit: Admitting: Family Medicine

## 2024-09-25 ENCOUNTER — Encounter: Payer: Self-pay | Admitting: Family Medicine

## 2024-09-25 ENCOUNTER — Ambulatory Visit: Payer: Self-pay | Admitting: Family Medicine

## 2024-09-25 VITALS — BP 112/70 | HR 75 | Temp 98.3°F | Wt 154.3 lb

## 2024-09-25 DIAGNOSIS — G43011 Migraine without aura, intractable, with status migrainosus: Secondary | ICD-10-CM

## 2024-09-25 DIAGNOSIS — Z2989 Encounter for other specified prophylactic measures: Secondary | ICD-10-CM

## 2024-09-25 DIAGNOSIS — K9049 Malabsorption due to intolerance, not elsewhere classified: Secondary | ICD-10-CM

## 2024-09-25 DIAGNOSIS — R110 Nausea: Secondary | ICD-10-CM

## 2024-09-25 LAB — CBC WITH DIFFERENTIAL/PLATELET
Basophils Absolute: 0 K/uL (ref 0.0–0.1)
Basophils Relative: 0.3 % (ref 0.0–3.0)
Eosinophils Absolute: 0 K/uL (ref 0.0–0.7)
Eosinophils Relative: 0.2 % (ref 0.0–5.0)
HCT: 40.8 % (ref 36.0–46.0)
Hemoglobin: 14.1 g/dL (ref 12.0–15.0)
Lymphocytes Relative: 23.7 % (ref 12.0–46.0)
Lymphs Abs: 1.7 K/uL (ref 0.7–4.0)
MCHC: 34.5 g/dL (ref 30.0–36.0)
MCV: 91.8 fl (ref 78.0–100.0)
Monocytes Absolute: 0.4 K/uL (ref 0.1–1.0)
Monocytes Relative: 5.3 % (ref 3.0–12.0)
Neutro Abs: 5 K/uL (ref 1.4–7.7)
Neutrophils Relative %: 70.5 % (ref 43.0–77.0)
Platelets: 248 K/uL (ref 150.0–400.0)
RBC: 4.44 Mil/uL (ref 3.87–5.11)
RDW: 12.4 % (ref 11.5–15.5)
WBC: 7.1 K/uL (ref 4.0–10.5)

## 2024-09-25 LAB — COMPREHENSIVE METABOLIC PANEL WITH GFR
ALT: 17 U/L (ref 0–35)
AST: 16 U/L (ref 0–37)
Albumin: 4.7 g/dL (ref 3.5–5.2)
Alkaline Phosphatase: 48 U/L (ref 39–117)
BUN: 17 mg/dL (ref 6–23)
CO2: 30 meq/L (ref 19–32)
Calcium: 9.7 mg/dL (ref 8.4–10.5)
Chloride: 102 meq/L (ref 96–112)
Creatinine, Ser: 0.82 mg/dL (ref 0.40–1.20)
GFR: 90.38 mL/min (ref >60.00)
Glucose, Bld: 81 mg/dL (ref 70–99)
Potassium: 3.8 meq/L (ref 3.5–5.1)
Sodium: 140 meq/L (ref 135–145)
Total Bilirubin: 0.7 mg/dL (ref 0.2–1.2)
Total Protein: 6.9 g/dL (ref 6.0–8.3)

## 2024-09-25 MED ORDER — ELETRIPTAN HYDROBROMIDE 40 MG PO TABS: ORAL_TABLET | ORAL | 6 refills | Status: AC

## 2024-09-25 MED ORDER — ACETAZOLAMIDE 125 MG PO TABS: ORAL_TABLET | ORAL | 0 refills | Status: DC

## 2024-09-25 MED ORDER — AIMOVIG 140 MG/ML ~~LOC~~ SOAJ: SUBCUTANEOUS | 3 refills | Status: AC

## 2024-09-25 NOTE — Progress Notes (Unsigned)
 Established Patient Office Visit  Subjective   Patient ID: Elizabeth Washington, female    DOB: March 26, 1986  Age: 38 y.o. MRN: [id number]  Chief Complaint  Patient presents with   Medication Refill    HPI   Elizabeth Washington is seen for follow-up regarding migraine headaches.  She has had migraines for years.  They seem to be clustered around her periods.  She had intolerance previously with multiple medications including Inderal  and Topamax .  She has had great success with Aimovig  140 mg once monthly and supplements with Relpax  as needed.  She has upcoming travel this winter to [place] .  She will be over [place] elevation.  Has taken Diamox  in the past and requesting refills  Other issue is she states over the past couple years she is developing tolerance with alcohol..  She has noted this particularly with margaritas.  Usually about 6 hours later has recurrent nausea and vomiting.  No history of any known food allergies.  Does not drink regularly.  No history of binge drinking.  Past Medical History:  Diagnosis Date   Kidney stone    Migraine    Miscarriage, threatened, early pregnancy 11/28/2014   Normal intrauterine pregnancy in third trimester 02/10/2016   SVD (spontaneous vaginal delivery) 02/11/2016   SVD (spontaneous vaginal delivery) 07/14/2017   Past Surgical History:  Procedure Laterality Date   KNEE SURGERY  2008, 2009, 2010   x3    reports that she has never smoked. She has never used smokeless tobacco. She reports current alcohol use. She reports that she does not use drugs. family history includes Arthritis in her mother; Cancer in her unknown relative; Heart disease in her father; Hyperlipidemia in her father. No Known Allergies  Review of Systems  Constitutional:  Negative for malaise/fatigue.  Eyes:  Negative for blurred vision.  Respiratory:  Negative for shortness of breath.   Cardiovascular:  Negative for chest pain.  Gastrointestinal:  Negative for abdominal  pain.  Neurological:  Negative for dizziness and weakness.      Objective:     BP 112/70   Pulse 75   Temp 98.3 F (36.8 C) (Oral)   Wt 154 lb 4.8 oz (70 kg)   SpO2 99%   BMI 23.46 kg/m  BP Readings from Last 3 Encounters:  09/25/24 112/70  02/05/24 117/82  01/28/24 103/67   Wt Readings from Last 3 Encounters:  09/25/24 154 lb 4.8 oz (70 kg)  08/16/23 154 lb 4.8 oz (70 kg)  11/12/22 149 lb 9.6 oz (67.9 kg)      Physical Exam Vitals reviewed.  Constitutional:      Appearance: She is well-developed.  Eyes:     Pupils: Pupils are equal, round, and reactive to light.  Neck:     Thyroid : No thyromegaly.     Vascular: No JVD.  Cardiovascular:     Rate and Rhythm: Normal rate and regular rhythm.     Heart sounds:     No gallop.  Pulmonary:     Effort: Pulmonary effort is normal. No respiratory distress.     Breath sounds: Normal breath sounds. No wheezing or rales.  Abdominal:     Palpations: Abdomen is soft.     Tenderness: There is no abdominal tenderness.     Comments: No hepatomegaly noted.  No abdominal tenderness.  Musculoskeletal:     Cervical back: Neck supple.  Neurological:     Mental Status: She is alert.      No results  found for any visits on 09/25/24.    The ASCVD Risk score (Arnett DK, et al., 2019) failed to calculate for the following reasons:   The 2019 ASCVD risk score is only valid for ages 109 to 34    Assessment & Plan:   #1 history of migraine headaches without aura.  She is currently doing well on regimen of Aimovig  and uses Relpax  for breakthrough as needed.  Refilled both medications for 1 year  #2 altitude sickness prevention.  Refill Diamox  for as needed use  #3 recently developed intolerance to alcohol with nausea and vomiting usually 6 hours after ingestion.  No recent use of Metronidazole.  Doubt liver dysfunction- but will check CMP/CBC.    Did discuss possibility of food intolerance/allergy, but does not sound like gluten  intolerance issue.     No follow-ups on file.    Wolm Scarlet, MD

## 2024-10-08 ENCOUNTER — Other Ambulatory Visit: Payer: Self-pay | Admitting: Family Medicine

## 2024-11-13 ENCOUNTER — Ambulatory Visit
